# Patient Record
Sex: Male | Born: 1940 | Race: White | Hispanic: No | Marital: Married | Smoking: Never smoker
Health system: Southern US, Community
[De-identification: ages and names within clinical notes are randomized; demographics above are authoritative.]

## PROBLEM LIST (undated history)

## (undated) DIAGNOSIS — I4891 Unspecified atrial fibrillation: Secondary | ICD-10-CM

## (undated) DIAGNOSIS — K5792 Diverticulitis of intestine, part unspecified, without perforation or abscess without bleeding: Secondary | ICD-10-CM

## (undated) DIAGNOSIS — N289 Disorder of kidney and ureter, unspecified: Secondary | ICD-10-CM

## (undated) DIAGNOSIS — E785 Hyperlipidemia, unspecified: Secondary | ICD-10-CM

## (undated) DIAGNOSIS — M1990 Unspecified osteoarthritis, unspecified site: Secondary | ICD-10-CM

## (undated) DIAGNOSIS — I2699 Other pulmonary embolism without acute cor pulmonale: Secondary | ICD-10-CM

## (undated) DIAGNOSIS — I2510 Atherosclerotic heart disease of native coronary artery without angina pectoris: Secondary | ICD-10-CM

## (undated) DIAGNOSIS — M109 Gout, unspecified: Secondary | ICD-10-CM

## (undated) DIAGNOSIS — N400 Enlarged prostate without lower urinary tract symptoms: Secondary | ICD-10-CM

## (undated) DIAGNOSIS — G4733 Obstructive sleep apnea (adult) (pediatric): Secondary | ICD-10-CM

## (undated) DIAGNOSIS — I10 Essential (primary) hypertension: Secondary | ICD-10-CM

## (undated) HISTORY — DX: Diverticulitis of intestine, part unspecified, without perforation or abscess without bleeding: K57.92

## (undated) HISTORY — DX: Unspecified atrial fibrillation: I48.91

## (undated) HISTORY — DX: Gout, unspecified: M10.9

## (undated) HISTORY — DX: Morbid (severe) obesity due to excess calories: E66.01

## (undated) HISTORY — DX: Atherosclerotic heart disease of native coronary artery without angina pectoris: I25.10

## (undated) HISTORY — DX: Hyperlipidemia, unspecified: E78.5

## (undated) HISTORY — DX: Unspecified osteoarthritis, unspecified site: M19.90

## (undated) HISTORY — DX: Benign prostatic hyperplasia without lower urinary tract symptoms: N40.0

## (undated) HISTORY — DX: Obstructive sleep apnea (adult) (pediatric): G47.33

## (undated) HISTORY — DX: Other pulmonary embolism without acute cor pulmonale: I26.99

## (undated) HISTORY — DX: Essential (primary) hypertension: I10

## (undated) HISTORY — DX: Disorder of kidney and ureter, unspecified: N28.9

---

## 2000-10-01 ENCOUNTER — Encounter: Admission: RE | Admit: 2000-10-01 | Discharge: 2000-10-01 | Payer: Self-pay | Admitting: Orthopedic Surgery

## 2000-10-01 ENCOUNTER — Encounter: Payer: Self-pay | Admitting: Orthopedic Surgery

## 2001-08-03 ENCOUNTER — Encounter (INDEPENDENT_AMBULATORY_CARE_PROVIDER_SITE_OTHER): Payer: Self-pay

## 2001-08-03 ENCOUNTER — Ambulatory Visit (HOSPITAL_COMMUNITY): Admission: RE | Admit: 2001-08-03 | Discharge: 2001-08-03 | Payer: Self-pay | Admitting: Gastroenterology

## 2001-09-03 ENCOUNTER — Ambulatory Visit (HOSPITAL_COMMUNITY): Admission: RE | Admit: 2001-09-03 | Discharge: 2001-09-03 | Payer: Self-pay | Admitting: Gastroenterology

## 2001-09-03 ENCOUNTER — Encounter: Payer: Self-pay | Admitting: Gastroenterology

## 2002-04-14 ENCOUNTER — Encounter: Admission: RE | Admit: 2002-04-14 | Discharge: 2002-04-14 | Payer: Self-pay | Admitting: Orthopedic Surgery

## 2002-04-14 ENCOUNTER — Encounter: Payer: Self-pay | Admitting: Orthopedic Surgery

## 2006-12-23 ENCOUNTER — Inpatient Hospital Stay (HOSPITAL_COMMUNITY): Admission: AD | Admit: 2006-12-23 | Discharge: 2006-12-29 | Payer: Self-pay | Admitting: Internal Medicine

## 2007-03-23 ENCOUNTER — Inpatient Hospital Stay (HOSPITAL_BASED_OUTPATIENT_CLINIC_OR_DEPARTMENT_OTHER): Admission: RE | Admit: 2007-03-23 | Discharge: 2007-03-23 | Payer: Self-pay | Admitting: Interventional Cardiology

## 2011-03-26 NOTE — Cardiovascular Report (Signed)
Andrew Liu, Andrew Liu NO.:  [id number]   MEDICAL RECORD NO.:  [id number]          PATIENT TYPE:  OIB   LOCATION:  1963                         FACILITY:  [facility]   PHYSICIAN:  Lyn Records, M.D.   DATE OF BIRTH:  [birth year]   DATE OF PROCEDURE:  DATE OF DISCHARGE:                            CARDIAC CATHETERIZATION   INDICATIONS:  The patient had an episode of atrial flutter in February  and as part of that hospitalization, evaluation with a Cardiolite study  demonstrated apical ischemia.  This study is being done to rule out  coronary atherosclerosis that could have had a role in the genesis of  the arrhythmia.   PROCEDURES PERFORMED:  1. Left heart catheterization.  2. Selective coronary angiography.  3. Left ventriculography.   DESCRIPTION:  After informed consent, a 4-French sheath was placed in  the right femoral artery using the modified Seldinger technique.  A 4-  [place] A2 multipurpose catheter was used for hemodynamic recordings,  left ventriculography by hand injection, and selective right coronary  angiography.  We used a #5 4-French left Judkins catheter for left  coronary angiography.  The patient tolerated the procedure without  complications.   RESULTS:   HEMODYNAMIC DATA:  1. Aortic pressure 100 31/66.  2. Left ventricular pressure 135/25.   LEFT VENTRICULOGRAPHY:  Left ventricle is mildly dilated.  Overall  contractility is low normal.  Estimated ejection fraction is 50%.  This  is a poor quality-study due to inadequate opacification of the LV.  We  were unable to determine the presence or absence of mitral  regurgitation.   CORONARY ANGIOGRAPHY:  1. Left main coronary artery:  Widely patent.  2. Left anterior descending coronary artery:  This vessel is widely      patent, wraps around left ventricular apex, gives origin to one      small distal diagonal.  Luminal irregularities are noted.  No      significant obstruction is seen.  3. Ramus intermedius branch:  The ramus intermedius coronary artery      arises from the distal left main and contains ostial an eccentric      60-70% narrowing.  This vessel is small to moderate in size.  4. Circumflex artery:  The circumflex coronary artery is large.  It      gives origin to two obtuse marginal branches.  The first obtuse      marginal is dominant and trifurcates on the left lateral wall.  The      circumflex is entirely normal.  5. Right coronary artery:  The right coronary artery is a dominant      vessel, gives PDA and two left ventricular branches.  The vessel is      ectatic with up to 30-40% proximal narrowing.  There is 30-40%      distal narrowing.  No significant obstruction is noted in this      vessel.   CONCLUSIONS:  1. Moderate diffuse luminal irregularities noted throughout the right      coronary with up to 40% narrowing proximally and  distally.  The      relatively small ramus intermedius contains 50-70% ostial      narrowing.  The left anterior descending and circumflex contain      minimal luminal irregularities and are widely patent.  2. Poorly-pacified left ventricle by ventriculography.  It appears      that the LV function is low normal.   DISCUSSION:  No significant coronary atherosclerosis is noted.  It is  felt that the reversible anteroapical defect was likely secondary to  soft tissue attenuation.   PLAN:  Continue aggressive risk factor modification, including  significant medical regimen for blood pressure control.      Lyn Records, M.D.  Electronically Signed     HWS/MEDQ  D:  03/23/2007  T:  03/23/2007  Job:  [id number]   cc:   Hal T. Stoneking, M.D.

## 2011-03-29 NOTE — H&P (Signed)
Andrew Liu, Andrew Liu                  ACCOUNT NO.:  [id number]   MEDICAL RECORD NO.:  [id number]          PATIENT TYPE:  INP   LOCATION:  3703                         FACILITY:  [facility]   PHYSICIAN:  Lavinia Sharps, N.P.  DATE OF BIRTH:  1941-10-17   DATE OF ADMISSION:  12/23/2006  DATE OF DISCHARGE:                              HISTORY & PHYSICAL   DICTATED FOR:  Dr. Merlene Laughter.   CHIEF COMPLAINT:  A 70 year old white male referred here from his  urology office following his yearly checkup this morning.   He was found to have a pulse of 135-140.   HISTORY OF PRESENT ILLNESS:  For 3 weeks, he has had chest pressure that  radiated to the right.  It feels more like heartburn than gas.  It comes  and goes.  He notes it is not associated with exertion, sweats or  nausea.   During our evaluation, he was found to be in atrial fibrillation with a  rate of 135.  We decided to admit him.   DRUG ALLERGIES:  NKDA.   CURRENT MEDICATIONS:  1. Enalapril 10 mg a day.  2. Lovastatin 40 mg a day.  3. Aspirin 325 mg 1 a day.  4. Multivitamin 1 a day.  5. Cardura 2 mg 1 at h.s.   KNOWN MEDICAL HISTORY:  1. Carpal tunnel syndrome.  2. Hypertension.  3. Obesity.  4. Varicose veins.  5. History of degenerative joint disease of the knees.  6. History of urinary tract infection.  7. History of colon polyp.  8. History of elevated cholesterol.   SOCIAL HISTORY:  He is married, with 1 son.  He is a retired  Probation officer.  He worked hard for many years.  He lives in [place].  He stopped smoking 9 years ago.  He never consumed alcohol.   REVIEW OF SYSTEMS:  Per chief complaint.   PHYSICAL EXAMINATION:  GENERAL APPEARANCE:  Height 5 feet 8 inches.  Weight 283 pounds.  Temperature 97.7.  Pulse 135.  Blood pressure  130/70.  SKIN:  Multiple seborrheic ketoses.  HEENT:  Eyes:  PERRL.  Ears:  Clear.  Oropharynx clear.  NECK:  No thyroid enlargement,  lymphadenopathy or carotid bruit.  CHEST:   Clear.  HEART:  AFib with a rate of 135.  ABDOMEN EXAM:  Soft and nontender.  GENITALIA:  Deferred since he saw Dr. Retta Diones this morning.  NEUROLOGIC EXAM:  Oriented x3.   ASSESSMENT:  New-onset atrial fibrillation.   PLAN:  Admit for further evaluation and treatment.      Lavinia Sharps, N.P.     MAP/MEDQ  D:  12/23/2006  T:  12/24/2006  Job:  [id number]

## 2011-03-29 NOTE — Discharge Summary (Signed)
Andrew Liu, Andrew Liu                  ACCOUNT NO.:  [id number]   MEDICAL RECORD NO.:  [id number]          PATIENT TYPE:  INP   LOCATION:  3703                         FACILITY:  [facility]   PHYSICIAN:  Barnetta Chapel, MDDATE OF BIRTH:  [birth year]   DATE OF ADMISSION:  12/23/2006  DATE OF DISCHARGE:  12/29/2006                               DISCHARGE SUMMARY   PRIMARY CARE Errika Narvaiz:  Dr. Pete Glatter.   ADMITTING DIAGNOSIS:  New onset atrial fibrillation.   DISCHARGE DIAGNOSES:  Includes:  1. New onset atrial fibrillation.  2. Obesity.  3. Suspect obstructive sleep apnea.  4. Chest pressure/chest pain, most likely secondary to atrial      fibrillation with rapid ventricular response.   DISCHARGE MEDICATIONS:  1. Enalapril 10 mg p.o. once daily.  2. Multivitamin 1 tab p.o. once daily.  3. Aspirin 325 mg p.o. once daily.  4. Cardura 2 mg p.o. q.h.s.  5. Lovastatin 40 mg p.o. once daily.  6. Metoprolol 100 mg p.o. once daily.  7. Coumadin 5 mg p.o. once daily (the patient's INR needs to be      checked in the next 2-4 days).  INR on the day of discharge is 1.8.   PROCEDURES AND INVESTIGATIONS DONE:  Include:  1. The patient had an adenosine Cardiolite, which revealed an ejection      fraction of 54%.  There were also concerns for a small area of      ischemia of the anterior apical area.  Because the patient was not      having any chest pain anymore, the cardiologist felt that they      would not proceed with a cardiac cath.  However, they will plan for      a cardiac cath if the patient develops chest pain after discharge      home.   BRIEF HISTORY AND HOSPITAL COURSE:  The patient is a 69 year old male  with past medical history significant for obesity, hypertension and  carpal tunnel syndrome.  The patient went to see his primary care  physician for a routine physical.  He was found to be in atrial  fibrillation with rapid ventricular response.  He was asked to come to  [facility] for admission and further workup.  On admission, he  was started on a Cardizem drip.  Metoprolol was added eventually.  The  patient converted to normal sinus rhythm after a couple of days.  Prior  to admission, he reported having nonspecific chest pain and pressures.  He is obese with symptoms suggestive of possible obstructive sleep  apnea.  The patient eventually had an adenosine Cardiolite (please see  the above for the results).  He was seen by Dr. Katrinka Blazing, Cardiologist with  the [facility].  On admission, he was started on percutaneous Lovenox,  as well as Coumadin.  INR, today, is 1.8.  He is going to be discharged  back home on Coumadin 5 mg p.o. once daily.  INR should be rechecked by  the primary care Majesti Gambrell in the next 2-4 days.  Vitals  are all stable.  The patient has remained in normal sinus rhythm.   DISCHARGE PLANS:  1. Discharge patient back home today.  2. Patient to follow up with the primary care physician in a week.  3. For patient to have his INR checked within the next 2-4 days.  4. The patient should follow up with Dr. Katrinka Blazing, the Cardiologist with      the [facility].  5. Low-fat/cardiac diet.  6. The patient will benefit from a sleep study as an outpatient.  7. Fall precaution.  8. Activity as tolerated.      Barnetta Chapel, MD  Electronically Signed     SIO/MEDQ  D:  12/29/2006  T:  12/30/2006  Job:  [id number]   cc:   Marvia Pickles, M.D.

## 2011-03-29 NOTE — Procedures (Signed)
[facility]  Patient:    Andrew Liu, Andrew Liu. Visit Number: [id number] MRN: [id number]          Service Type: Attending:  Verlin Grills, M.D. Proc. Date: 08/03/01                             Procedure Report  PROCEDURE PERFORMED:  Colonoscopy to the mid ascending colon.  ENDOSCOPIST:  Verlin Grills, M.D.  INDICATIONS:  Andrew Liu, Lassen., (date of birth 03/11/2041) is a 70 year old male who intermittently passes fresh blood with constipated bowel movements. His younger sister was diagnosed with colon cancer.  Dr. Dot Been is due for his first colonoscopy with polypectomy to prevent colon cancer.  ALLERGIES:  No known drug allergies.  MEDICATIONS:  Chronic medications include Monopril, aspirin, and multivitamins.  PAST MEDICAL/SURGICAL HISTORY:  Significant for colonic diverticulosis by flexible proctosigmoidoscopy, hypertension, herniated disk surgery, herniorrhaphy, and carpal tunnel release surgery.  PREMEDICATIONS:  Versed 8 mg and Demerol 50 mg.  ENDOSCOPE:  Olympus pediatric colonoscope.  DESCRIPTION OF PROCEDURE:  After obtaining informed consent, Andrew Liu was placed in the left lateral decubitus position.  I administered intravenous Versed and intravenous Demerol to achieve conscious sedation for the procedure.  The patients blood pressure, oxygen saturation, and cardiac rhythm were monitored throughout the procedure and documented in the medical record.  Anal inspection is normal.  Digital rectal exam revealed a non-nodular prostate.  The Olympus pediatric video colonoscope was introduced into the rectum and advanced to the mid ascending colon at which point, the entire colonoscope had been introduced into the colon.  Repositioning the patient from the left lateral decubitus position to the supine position and then to the right lateral decubitus position with an abdominal pressure that would not allow any further visualization  with the colonoscope.  Colonic preparation for the exam today was excellent.  Rectum normal.  Sigmoid colon and descending colon showed extensive left colonic diverticulosis.  At approximately 40 cm from the anal verge, a 2 mm sessile polyp was removed with the electrocautery snare and submitted for pathologic interpretation.  Splenic flexure normal.  Transverse colon normal.  From the hepatic flexure, a 0.5 mm sessile polyp was removed with the cold biopsy forceps.  Ascending colon normal.  Cecum and ileocecal valve were not examined.  ASSESSMENT: 1. Excessive left colonic diverticulosis. 2. A 0.5 mm sessile polyp was removed from the hepatic flexure    with the cold biopsy forceps and a 2-mm sessile polyp was    removed from the sigmoid colon with the electrocautery snare.    Both polyps were submitted in one bottle for pathologic    examination. 3. I was unable to reach the cecum with the colonoscope.  RECOMMENDATIONS:  I will schedule Andrew Liu for an air contrast barium enema in approximately four weeks. Attending:  Verlin Grills, M.D. DD:  08/03/01 TD:  08/03/01 Job: [id number]

## 2011-03-29 NOTE — Consult Note (Signed)
NAMEGWEN, EDLER NO.:  [id number]   MEDICAL RECORD NO.:  [id number]          PATIENT TYPE:  INP   LOCATION:  3703                         FACILITY:  [facility]   PHYSICIAN:  Lyn Records, M.D.   DATE OF BIRTH:  June 18, 1941   DATE OF CONSULTATION:  DATE OF DISCHARGE:                                 CONSULTATION   REASON FOR CONSULTATION:  Atrial flutter.   CONCLUSIONS:  1. Atrial flutter with rapid ventricular response.  Duration of      probably two to three weeks.  2. History of hypertension.  3. Hyperlipidemia.  4. Obesity.  5. Probable obstructive sleep apnea.   RECOMMENDATIONS:  1. Given the duration of the patient's atrial flutter and the      difficulty we will have with attempting rate control, will arrange      transesophageal-guided electrical cardioversion for December 25, 2006.  2. Will continue IV heparin and/or Lovenox.  3. Transthoracic echo.  4. Consider ischemic evaluation after the patient is in rhythm.  5. Initiate anti-arrhythmic therapy if recurrent atrial flutter.  6. Chronic Coumadin therapy for at least a month following      cardioversion.  7. Ischemic evaluation perhaps as an outpatient, especially if chest      discomfort continues.   DESCRIPTION:  The patient is 70 and has been in relatively good health.  He is obese.  His wife complains that he snores very loudly and she can  hear him from one end of the house to the other.  He stops breathing at  night.  It is easy for him to fall asleep during the daytime.   Three weeks ago he began noticing that any activity will wear him out.  He feels short of breath and tight in the chest.  No orthopnea or PND.  He did hear some wheezing.  He has not had syncope but has had the  sensation that his heart is racing.  He saw his urologist yesterday and  he discovered that the patient's heart was racing and referred him to  Dr. Pete Glatter, who identified atrial flutter and admitted  him to the  hospital.  He has been in the hospital overnight on IV Cardizem with  poor rate control.   CURRENT MEDICAL REGIMEN:  1. Enalapril 10 mg per day.  2. Lovastatin 40 mg per day.  3. Aspirin 325 mg per day.  4. Cardura 2 mg q.h.s.  5. Multiple vitamins 1 per day.   SIGNIFICANT MEDICAL PROBLEMS:  1. Varicose veins.  2. Obesity.  3. Hypertension.  4. Carpal tunnel syndrome.  5. Degenerative joint disease.  6. History of herniated disk in 1988.  7. Hernia repair in 1989.  8. Arthroscopic surgery of both knees.   FAMILY HISTORY:  Mother died of progressive dementia.  Father died of  prostate cancer at age 22.  Has a half brother with Parkinson's.  A half  sister with degenerative joint disease.   REVIEW OF SYSTEMS:  Appetite is good.  Stopped smoking nine years ago.  Denies ethanol consumption.   PHYSICAL EXAMINATION:  GENERAL: The patient is in no acute distress.  VITAL SIGNS: Blood pressure 115/72.  Heart rate 80 to 115.  O2  saturation on 1 liter per minute is 95%.  Unable to assess the patient's  neck veins.  CHEST: Clear.  CARDIAC EXAM: Reveals a rapid irregular rhythm.  No murmur, no gallop.  ABDOMEN: Soft.  EXTREMITIES: No edema.  Pulses 2+ and symmetric in the upper and lower  extremities.   LABORATORY DATA:  Reveals typical flutter on EKG with upright P flutter  waves inferiorly and inverted flutter waves in lead V1.  Rate of 135  beats per minute.  No acute ST T-wave change.  Potassium on admission  4.1.  Creatinine 1.15.  Hemoglobin 14.3.  Troponin I and CK-MB are  normal.  INR is 1.1.  TSH 1.2.   DISCUSSION:  The patient has atrial flutter.  We will have difficulty  with rate control.  He feels poorly with the rapid heart rate.  We will  go ahead and try to facilitate conversion to normal rhythm by pursuing a  transesophageal echo.  We will continue beta blocker therapy.  He will  need to have an ischemic evaluation after his heart rhythm is   controlled.  He will need Coumadin therapy for at least four weeks  following conversion.      Lyn Records, M.D.  Electronically Signed     HWS/MEDQ  D:  12/24/2006  T:  12/24/2006  Job:  [id number]   cc:   Hal T. Stoneking, M.D.

## 2012-02-21 ENCOUNTER — Ambulatory Visit (HOSPITAL_COMMUNITY)
Admission: RE | Admit: 2012-02-21 | Discharge: 2012-02-21 | Disposition: A | Discharge status: Home / Self Care | Payer: Medicare Other | Source: Ambulatory Visit | Attending: Urology | Admitting: Urology

## 2012-02-21 ENCOUNTER — Other Ambulatory Visit: Payer: Self-pay | Admitting: Urology

## 2012-02-21 DIAGNOSIS — R042 Hemoptysis: Secondary | ICD-10-CM | POA: Insufficient documentation

## 2014-01-02 IMAGING — CR DG CHEST 2V
2 series · 2 of 2 positions shown · non-contrast
Comparison: 12/24/2006

CLINICAL DATA: Hemoptysis

CHEST - 2 VIEW

[w chest pa]
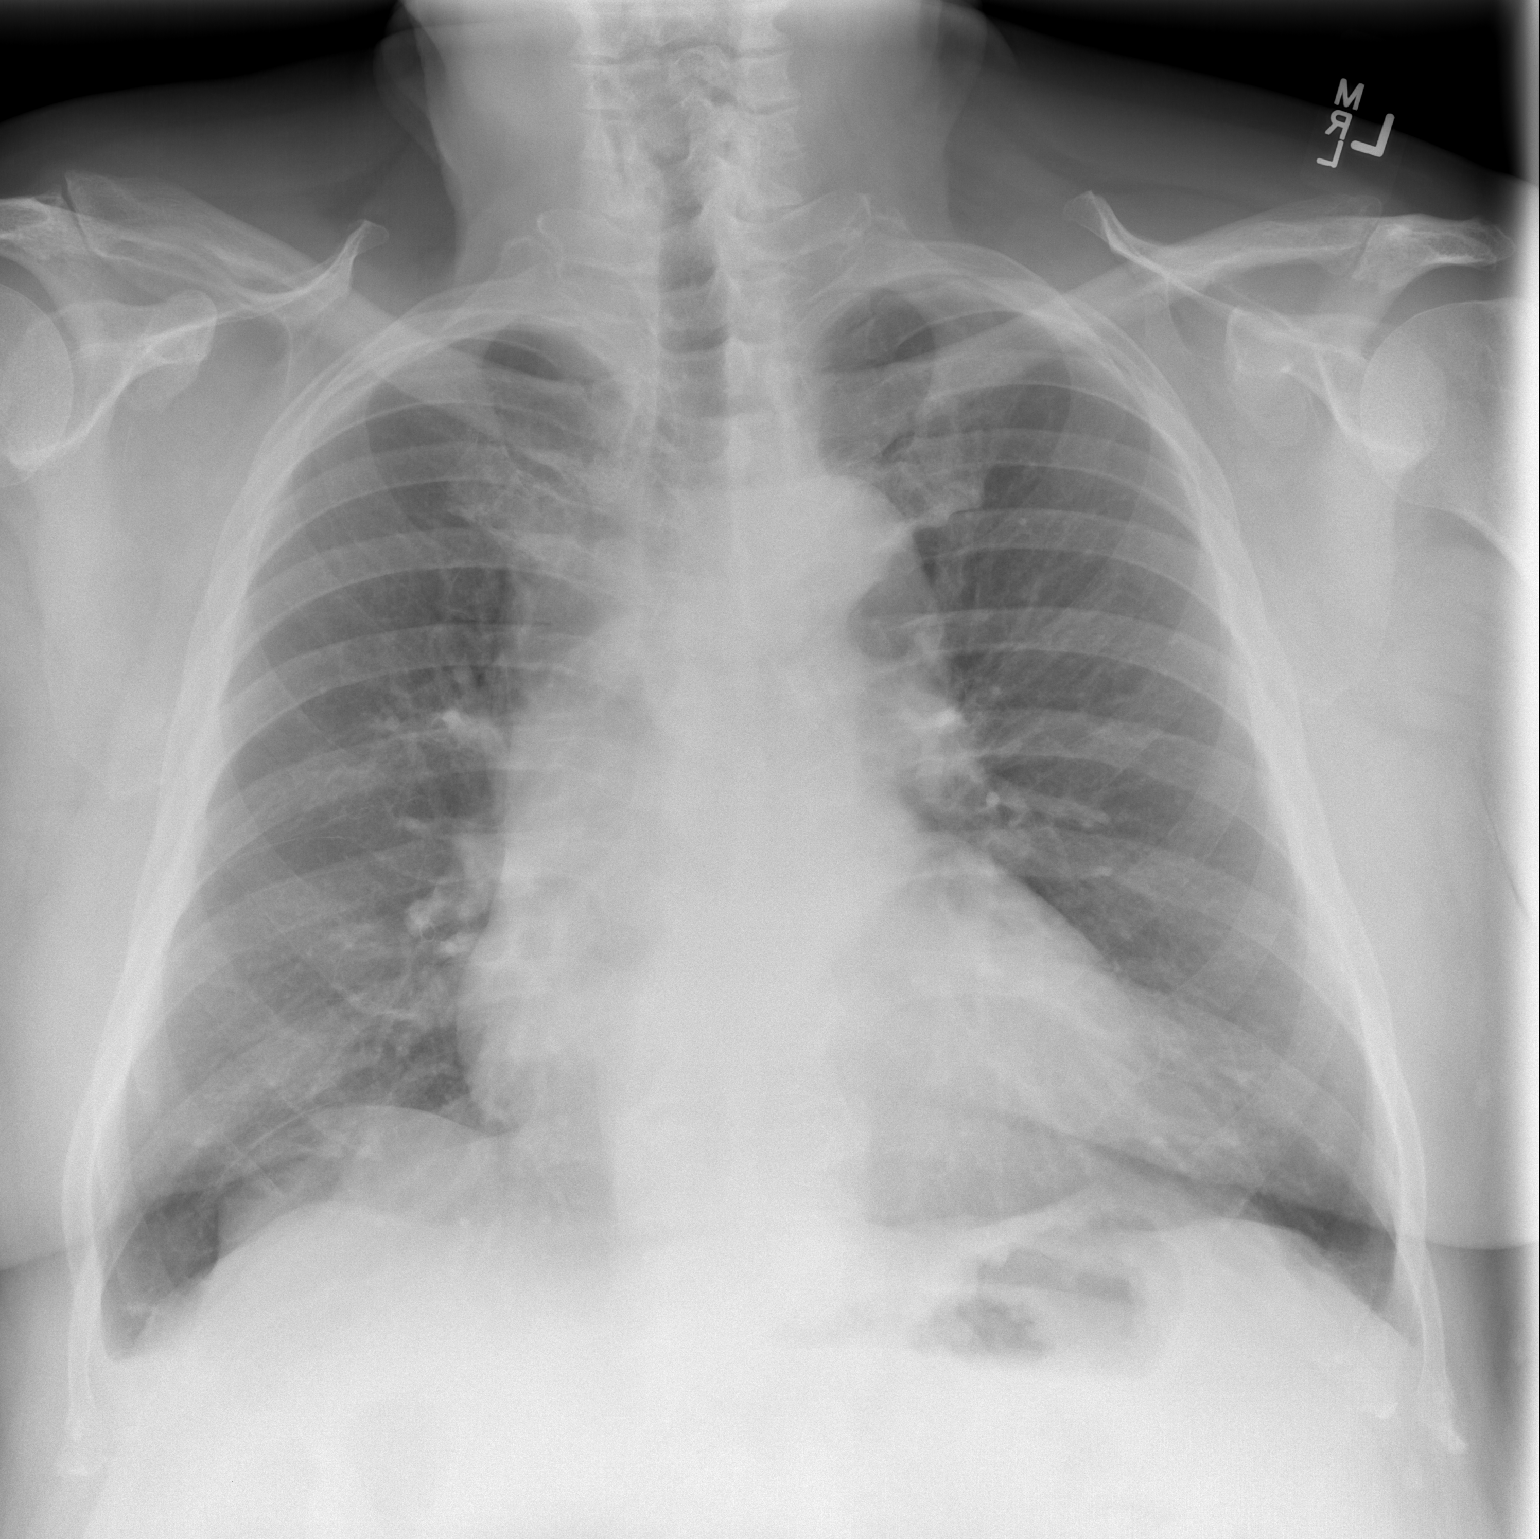

[w chest lat]
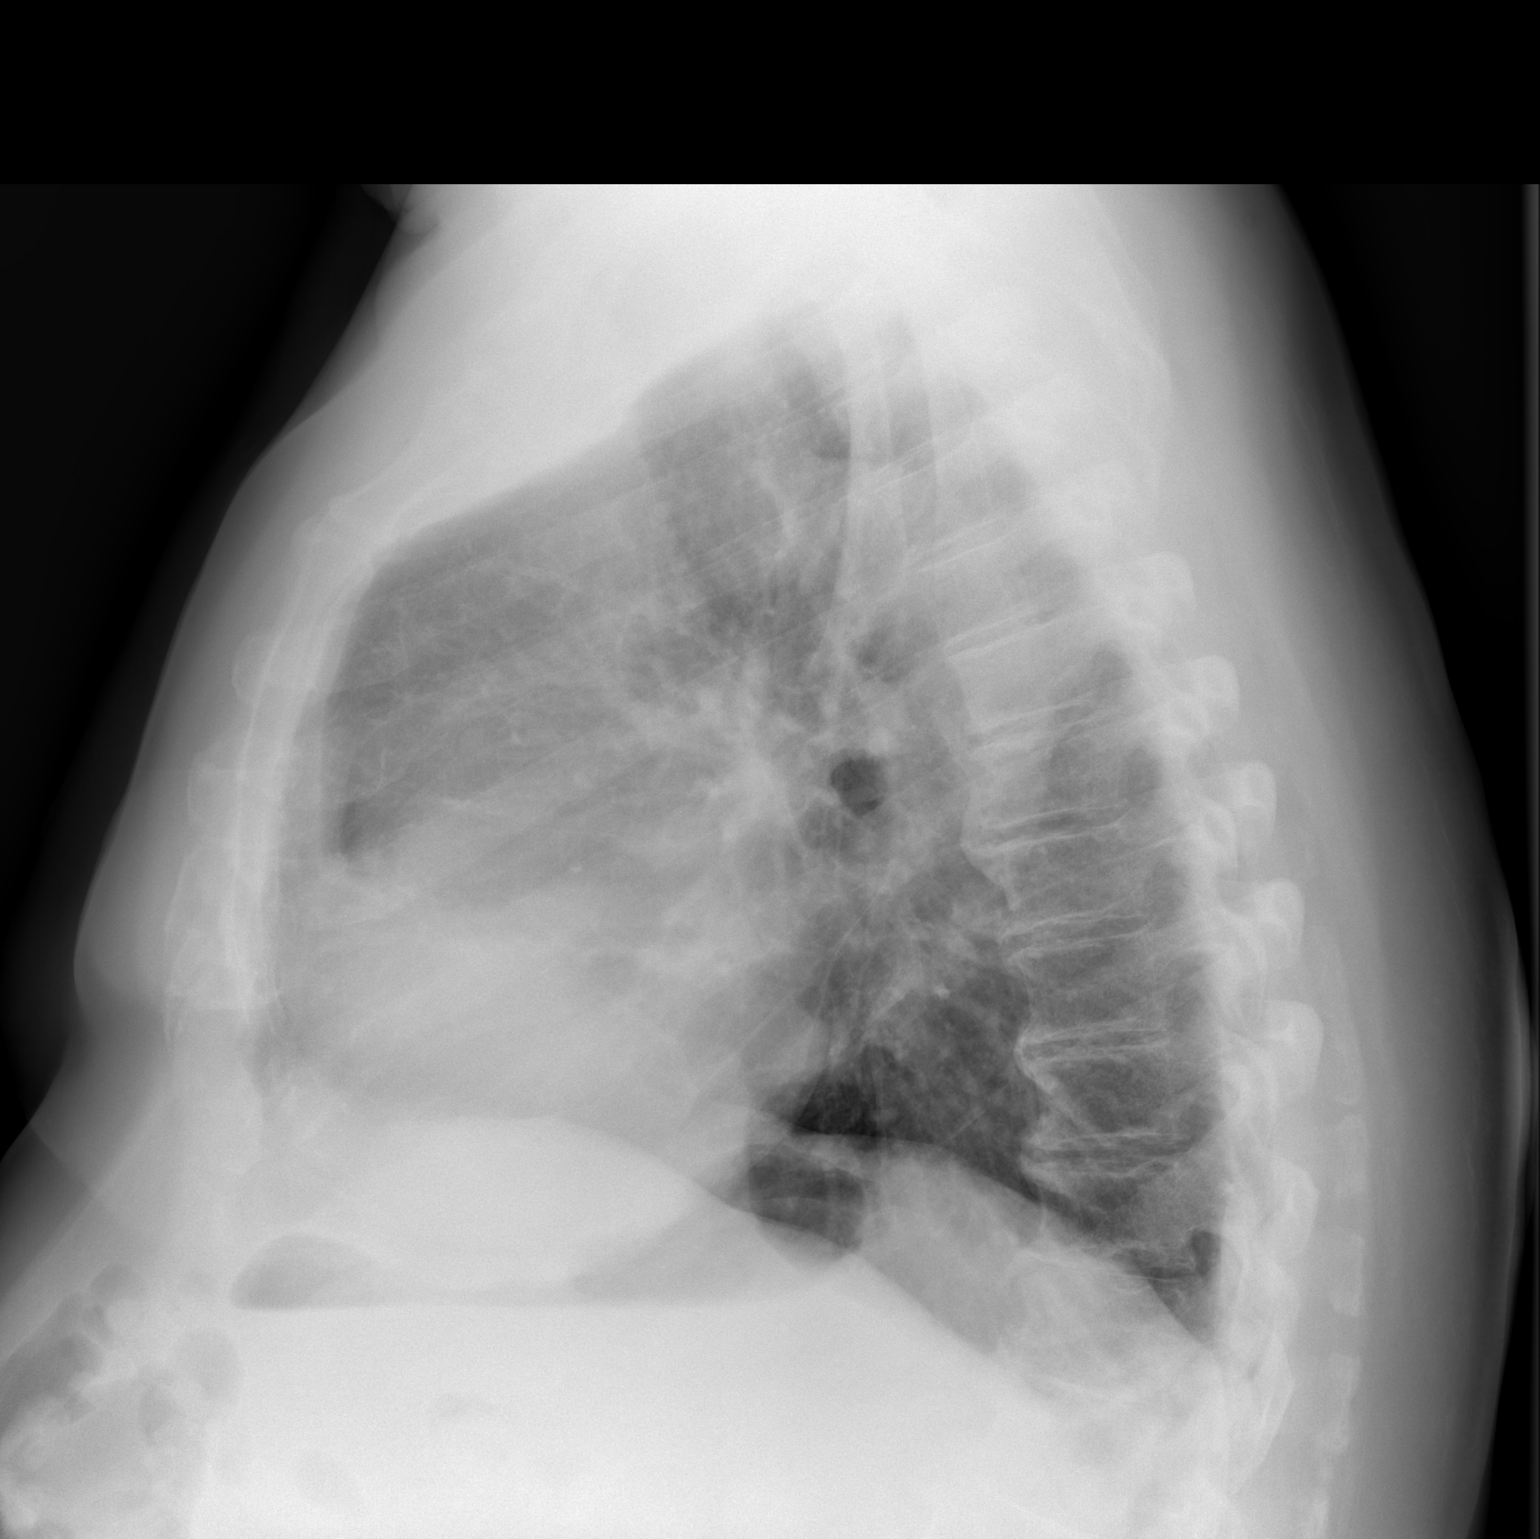

[2 of 2 positions shown; findings below may reference images not displayed]

FINDINGS: Cardiomediastinal silhouette is unremarkable.  No acute
infiltrate or pleural effusion.  No pulmonary edema.  Mild tenting
of the right hemidiaphragm.  Degenerative changes thoracic spine.
IMPRESSION: No active disease.

## 2023-04-17 ENCOUNTER — Encounter: Payer: Self-pay | Admitting: *Deleted

## 2023-04-17 ENCOUNTER — Other Ambulatory Visit: Payer: Self-pay | Admitting: *Deleted

## 2023-04-21 ENCOUNTER — Ambulatory Visit (INDEPENDENT_AMBULATORY_CARE_PROVIDER_SITE_OTHER): Payer: Medicare HMO | Admitting: Neurology

## 2023-04-21 ENCOUNTER — Encounter: Payer: Self-pay | Admitting: Neurology

## 2023-04-21 VITALS — BP 98/61 | HR 62 | Ht 69.0 in | Wt 250.0 lb

## 2023-04-21 DIAGNOSIS — G4733 Obstructive sleep apnea (adult) (pediatric): Secondary | ICD-10-CM

## 2023-04-21 DIAGNOSIS — E669 Obesity, unspecified: Secondary | ICD-10-CM

## 2023-04-21 DIAGNOSIS — R351 Nocturia: Secondary | ICD-10-CM

## 2023-04-21 DIAGNOSIS — G4719 Other hypersomnia: Secondary | ICD-10-CM

## 2023-04-21 DIAGNOSIS — I480 Paroxysmal atrial fibrillation: Secondary | ICD-10-CM

## 2023-04-21 NOTE — Progress Notes (Signed)
Subjective:    Andrew Liu ID: Andrew Andrew Liu is a 82 y.o. male.  HPI    Huston Foley, MD, PhD [facility] [address] [address] [place], [place] [address]  Dear Dr. Mikey Bussing,   I saw your Andrew Liu, Andrew Andrew Liu, upon your kind request in my sleep clinic today for initial consultation of his sleep disorder, in particular, evaluation of his prior diagnosis of sleep apnea.  Andrew Andrew Liu is unaccompanied today.  As you know, Andrew Andrew Liu is an 82 year old male with an underlying medical history of hypertension, chronic kidney disease, gout, paroxysmal A-fib on Eliquis, hyperlipidemia, reflux disease, allergies, BPH, coronary artery disease, aortic atherosclerosis, osteoarthritis, history of PE, and obesity, who was previously diagnosed with sleep apnea and placed on PAP therapy.  His Epworth sleepiness score is 14 out of 24, fatigue severity score is 51 out of 63.  I reviewed your office note from 02/18/2023.  Prior sleep study results are not available for my review today.  Andrew Andrew Liu has reportedly been on BiPAP therapy but has not used his machine in at least 6 months.  Andrew Andrew Liu did not bring Andrew machine today, a compliance download is not available for my review today.  Andrew Andrew Liu reports that Andrew Andrew Liu had a BiPAP machine, Andrew Andrew Liu had a [employer] which was recalled, Andrew Andrew Liu then apparently purchased a machine out-of-pocket through his DME provider, [facility] Andrew Liu but had trouble tolerating Andrew pressure, unclear what pressure settings Andrew Andrew Liu was on, Andrew Andrew Liu recalls a pressure setting of 5-15.  This could be an AutoPap machine.  Andrew Andrew Liu is not using this machine either.  Overall, when Andrew Andrew Liu first got his old machine, Andrew Andrew Liu felt benefit, particularly better sleep consolidation and better sleep quality, less daytime somnolence.  Andrew Andrew Liu is widowed and lives alone, Andrew Andrew Liu lost his only son at age 19 from pancreatic cancer.   His bedtime is generally between 11 PM and midnight and rise time around 8.  Andrew Andrew Liu has nocturia about 2-3 times per  average night.  Andrew Andrew Liu had a tonsillectomy at age 87.  Andrew Andrew Liu is not aware of any family history of sleep apnea. His sleep study was about 11 years ago.  Andrew Andrew Liu would be willing to get retested and start treatment again with a PAP machine. His Past Medical History Is Significant For: Past Medical History:  Diagnosis Date   A-fib (HCC)    Benign prostate hyperplasia    Coronary artery disease    Diverticulitis    Gout    Hyperlipidemia    Hypertension    Kidney disease    Stage 3B   Morbid obesity (HCC)    OSA treated with BiPAP    Osteoarthritis    both knees   Pulmonary embolism (HCC)     His Past Surgical History Is Significant For: History reviewed. No pertinent surgical history.  His Family History Is Significant For: Family History  Problem Relation Age of Onset   Sleep apnea Neg Hx     His Social History Is Significant For: Social History   Socioeconomic History   Marital status: Married    Spouse name: Not on file   Number of children: Not on file   Years of education: Not on file   Highest education level: Not on file  Occupational History   Not on file  Tobacco Use   Smoking status: Never   Smokeless tobacco: Never  Substance and Sexual Activity   Alcohol use: Not on file   Drug use: Not on file  Sexual activity: Not on file  Other Topics Concern   Not on file  Social History Narrative   Not on file   Social Determinants of Health   Financial Resource Strain: Not on file  Food Insecurity: Not on file  Transportation Needs: Not on file  Physical Activity: Not on file  Stress: Not on file  Social Connections: Not on file    His Allergies Are:  No Known Allergies:   His Current Medications Are:  Outpatient Encounter Medications as of 04/21/2023  Medication Sig   allopurinol (ZYLOPRIM) 100 MG tablet Take 100 mg by mouth 2 (two) times daily.   apixaban (ELIQUIS) 5 MG TABS tablet Take 5 mg by mouth 2 (two) times daily.   azelastine (ASTELIN) 0.1 % nasal  spray Place 1 spray into both nostrils as needed for rhinitis. Use in each nostril as directed   doxazosin (CARDURA) 2 MG tablet Take 2 mg by mouth 2 (two) times daily.   furosemide (LASIX) 20 MG tablet Take 20 mg by mouth as needed.   lovastatin (MEVACOR) 40 MG tablet Take 40 mg by mouth at bedtime.   metoprolol succinate (TOPROL-XL) 50 MG 24 hr tablet Take 50 mg by mouth daily.   pantoprazole (PROTONIX) 40 MG tablet Take 40 mg by mouth daily.   No facility-administered encounter medications on file as of 04/21/2023.  :   Review of Systems:  Out of a complete 14 point review of systems, all are reviewed and negative with Andrew exception of these symptoms as listed below:  Review of Systems  Neurological:        Pt here for sleep consult Pt states fatigue,hypertension Pt denies headaches,snoring Pt states had sleep study 11 years ago . Pt states  haven't   used current pap machine for 6 months     ESS:14  FSS:51    Objective:  Neurological Exam  Physical Exam Physical Examination:   Vitals:   04/21/23 1423  BP: 98/61  Pulse: 62    General Examination: Andrew Andrew Liu is a very pleasant 82 y.o. male in no acute distress. Andrew Andrew Liu appears well-developed and well-nourished and well groomed.   HEENT: Normocephalic, atraumatic, pupils are equal, round and reactive to light, extraocular tracking is good without limitation to gaze excursion or nystagmus noted. Hearing is grossly intact. Face is symmetric with normal facial animation.  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  Speech is clear with no dysarthria noted. There is no hypophonia. There is no lip, neck/head, jaw or voice tremor. Neck is supple with full range of passive and active motion. There are no carotid bruits on auscultation. Oropharynx exam reveals: moderate mouth dryness, adequate dental hygiene with few teeth on Andrew bottom, edentulous on top, no dentures.  Neck circumference 17 7/8 inches.  Tongue protrudes centrally and  palate elevates symmetrically.    Chest: Clear to auscultation without wheezing, rhonchi or crackles noted.  Heart: S1+S2+0, regular and normal without murmurs, rubs or gallops noted.   Abdomen: Soft, non-tender and non-distended.  Extremities: There is trace pitting edema in Andrew distal lower extremities bilaterally.   Skin: Warm and dry without trophic changes noted.   Musculoskeletal: exam reveals no obvious joint deformities.   Neurologically:  Mental status: Andrew Andrew Liu is awake, alert and oriented in all 4 spheres. His immediate and remote memory, attention, language skills and fund of knowledge are appropriate. There is no evidence of aphasia, agnosia, apraxia or anomia. Speech is clear with normal prosody and  enunciation. Thought process is linear. Mood is normal and affect is normal.  Cranial nerves II - XII are as described above under HEENT exam.  Motor exam: Normal bulk, strength and tone is noted. There is no obvious action or resting tremor.  Fine motor skills and coordination: grossly intact.  Cerebellar testing: No dysmetria or intention tremor. There is no truncal or gait ataxia.  Sensory exam: intact to light touch in Andrew upper and lower extremities.  Gait, station and balance: Andrew Andrew Liu stands easily. No veering to one side is noted. No leaning to one side is noted. Posture is age-appropriate and stance is narrow based. Gait shows normal stride length and normal pace. No problems turning are noted.   Assessment and Plan:  In summary, Andrew Andrew Liu is a very pleasant 82 y.o.-year old male with an underlying medical history of hypertension, chronic kidney disease, gout, paroxysmal A-fib on Eliquis, hyperlipidemia, reflux disease, allergies, BPH, coronary artery disease, aortic atherosclerosis, osteoarthritis, history of PE, and obesity, who presents for evaluation of his sleep apnea.  Andrew Andrew Liu was diagnosed with a laboratory attended sleep study 711 years ago and had a BiPAP machine or  perhaps an AutoPap machine.  Andrew Andrew Liu is advised to proceed with sleep testing.  Andrew Andrew Liu has benefited from PAP therapy in Andrew past and would like to get a new machine.   I had a long chat with Andrew Andrew Liu about my findings and Andrew diagnosis of sleep apnea, particularly OSA, its prognosis and treatment options. We talked about medical/conservative treatments, surgical interventions and non-pharmacological approaches for symptom control. I explained, in particular, Andrew risks and ramifications of untreated moderate to severe OSA, especially with respect to developing cardiovascular disease down Andrew road, including congestive heart failure (CHF), difficult to treat hypertension, cardiac arrhythmias (particularly A-fib), neurovascular complications including TIA, stroke and dementia. Even type 2 diabetes has, in part, been linked to untreated OSA. Symptoms of untreated OSA may include (but may not be limited to) daytime sleepiness, nocturia (i.e. frequent nighttime urination), memory problems, mood irritability and suboptimally controlled or worsening mood disorder such as depression and/or anxiety, lack of energy, lack of motivation, physical discomfort, as well as recurrent headaches, especially morning or nocturnal headaches. We talked about Andrew importance of maintaining a healthy lifestyle and striving for healthy weight.  I recommended a sleep study at this time. I outlined Andrew differences between a laboratory attended sleep study which is considered more comprehensive and accurate over Andrew option of a home sleep test (HST); Andrew latter may lead to underestimation of sleep disordered breathing in some instances and does not help with diagnosing upper airway resistance syndrome and is not accurate enough to diagnose primary central sleep apnea typically. I outlined possible surgical and non-surgical treatment options of OSA, including Andrew use of a positive airway pressure (PAP) device (i.e. CPAP, AutoPAP/APAP or BiPAP in  certain circumstances), a custom-made dental device (aka oral appliance, which would require a referral to a specialist dentist or orthodontist typically, and is generally speaking not considered for patients with full dentures or edentulous state), upper airway surgical options, such as traditional UPPP (which is not considered a first-line treatment) or Andrew Inspire device (hypoglossal nerve stimulator, which would involve a referral for consultation with an ENT surgeon, after careful selection, following inclusion criteria - also not first-line treatment).  We mutually agreed to pursue PAP therapy again for him.  We will pick up our discussion about Andrew next steps and treatment options after testing.  We will  keep him posted as to Andrew test results by phone call and/or MyChart messaging where possible.  We will plan to follow-up in sleep clinic accordingly as well.  I answered all his questions today and Andrew Andrew Liu was in agreement.   I encouraged him to call with any interim questions, concerns, problems or updates or email [place] through MyChart.  Generally speaking, sleep test authorizations may take up to 2 weeks, sometimes less, sometimes longer, Andrew Andrew Liu is encouraged to get in touch with [place] if they do not hear back from Andrew sleep lab staff directly within Andrew next 2 weeks.  Thank you very much for allowing me to participate in Andrew care of this nice Andrew Liu. If I can be of any further assistance to you please do not hesitate to call me at [phone].  Sincerely,   Huston Foley, MD, PhD

## 2023-04-21 NOTE — Patient Instructions (Signed)

## 2023-05-02 ENCOUNTER — Telehealth: Payer: Self-pay | Admitting: Neurology

## 2023-05-02 NOTE — Telephone Encounter (Signed)
[employer] pending uploaded notes.

## 2023-05-05 NOTE — Telephone Encounter (Signed)
Checked status on the poral it is still pending.

## 2023-05-07 NOTE — Telephone Encounter (Signed)
[employer] medicare [place]kley Harvey: [id number] (exp. 05/02/23 to 11/02/23)

## 2023-06-09 NOTE — Telephone Encounter (Signed)
PSG- [employer] medicare auth: [id number] (exp. 05/02/23 to 11/02/23   Patient is scheduled at [facility] for 08/18/23 at 9 pm.  Mailed packet to the patient.

## 2023-08-18 ENCOUNTER — Ambulatory Visit (INDEPENDENT_AMBULATORY_CARE_PROVIDER_SITE_OTHER): Payer: Medicare HMO | Admitting: Neurology

## 2023-08-18 DIAGNOSIS — G4733 Obstructive sleep apnea (adult) (pediatric): Secondary | ICD-10-CM | POA: Diagnosis not present

## 2023-08-18 DIAGNOSIS — I480 Paroxysmal atrial fibrillation: Secondary | ICD-10-CM

## 2023-08-18 DIAGNOSIS — R9431 Abnormal electrocardiogram [ECG] [EKG]: Secondary | ICD-10-CM

## 2023-08-18 DIAGNOSIS — G4761 Periodic limb movement disorder: Secondary | ICD-10-CM

## 2023-08-18 DIAGNOSIS — E669 Obesity, unspecified: Secondary | ICD-10-CM

## 2023-08-18 DIAGNOSIS — G4719 Other hypersomnia: Secondary | ICD-10-CM

## 2023-08-18 DIAGNOSIS — G4720 Circadian rhythm sleep disorder, unspecified type: Secondary | ICD-10-CM

## 2023-08-18 DIAGNOSIS — R351 Nocturia: Secondary | ICD-10-CM

## 2023-08-19 NOTE — Procedures (Unsigned)
Physician Interpretation:    Referred by: Lindwood Qua, MD    History and Indication for Testing: 82 year old male with an underlying medical history of hypertension, chronic kidney disease, gout, paroxysmal A-fib on Eliquis, hyperlipidemia, reflux disease, allergies, BPH, coronary artery disease, aortic atherosclerosis, osteoarthritis, history of PE, and obesity, who was previously diagnosed with sleep apnea and placed on PAP therapy.  His Epworth sleepiness score is 14 out of 24, fatigue severity score is 51 out of 63.  He may have been on an autoPAP and/or a BiPAP machine in the past, but has not used a PAP machine in over 9 months. He presents for re-evaluation of his sleep apnea.    EEG: Review of the EEG showed no abnormal electrical discharges and symmetrical bihemispheric findings.     EKG: The EKG revealed normal sinus rhythm (NSR) with occasional PVCs and PACs noted. ***   AUDIO/VIDEO REVIEW: The audio and video review did not show any abnormal or unusual behaviors, movements, phonations or vocalizations with the exception of rare brief sleep talking***. The patient took 2 restroom breaks. Snoring was noted, mostly in the mild range.    POST-STUDY QUESTIONNAIRE: Post study, the patient indicated, that sleep was worse than usual.    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 Treatment with a positive airway pressure (PAP) device is recommended.  The patient will be advised to proceed with home autoPAP therapy for now.  A laboratory attended, full night PAP-titration study can be considered down the road, to  optimize therapy settings, mask fit, monitoring of tolerance and of proper oxygen saturations. Other treatment options may be limited, and may include (generally speaking) surgical options in selected patients. *** Concomitant weight loss is highly recommended. Please note that untreated obstructive sleep apnea may carry additional perioperative morbidity. Patients with significant obstructive sleep apnea should receive perioperative PAP therapy and the surgeons and particularly the anesthesiologist should be informed of the diagnosis and the severity of the sleep disordered breathing. This study shows sleep fragmentation and abnormal sleep stage percentages; these are nonspecific findings and per se do not signify an intrinsic sleep disorder or a cause for the patient's sleep-related symptoms. Causes include (but are not limited to) the first night effect of the sleep study, circadian rhythm disturbances, medication effect or an underlying mood disorder or medical problem.  The patient should be cautioned not to drive, work at heights, or operate dangerous or heavy equipment when tired or sleepy. Review and reiteration of good sleep hygiene measures should be pursued with any patient. Moderate PLMs (periodic limb movements of sleep) were noted during this study with no significant arousals; clinical correlation is recommended. PLMs may improve with OSA treatment.   The study showed occasional PACs and PVCs on single lead EKG; clinical correlation is recommended. The patient is well-known to cardiology (at [facility]).  The patient will be seen in follow-up in the sleep clinic at [facility] for discussion of the test results, symptom and treatment compliance review, further management strategies, etc. The patient and the referring provider will be notified of the test results.   I certify that I have reviewed the entire raw data recording prior  to the issuance of this report in accordance with the Standards of  Accreditation of the American Academy of Sleep Medicine (AASM).   Huston Foley, MD, PhD Medical Director, [facility]) Diplomat, ABPN (Neurology and Sleep)   Technical Report:   General Information  Name: Lenardo, Westwood BMI: 36.92 Physician: Huston Foley, MD  ID: [id number] Height: 69.0 in Technician: Domingo Cocking, RPSGT  Sex: Male Weight: 250.0 lb Record: [id number]  Age: 3 [01-29-1941] Date: 08/18/2023    Medical & Medication History    Mr. Mincey is an 82 year old male with an underlying medical history of hypertension, chronic kidney disease, gout, paroxysmal A-fib on Eliquis, hyperlipidemia, reflux disease, allergies, BPH, coronary artery disease, aortic atherosclerosis, osteoarthritis, history of PE, and obesity, who was previously diagnosed with sleep apnea and placed on PAP therapy. His Epworth sleepiness score is 14 out of 24, fatigue severity score is 51 out of 63. I reviewed your office note from 02/18/2023. Prior sleep study results are not available for my review today. He has reportedly been on BiPAP therapy but has not used his machine in at least 6 months. He did not bring the machine today, a compliance download is not available for my review today. He reports that he had a BiPAP machine, he had a [employer] which was recalled, he then apparently purchased a machine out-of-pocket through his DME provider, [facility] patient but had trouble tolerating the pressure, unclear what pressure settings he was on, he recalls a pressure setting of 5-15. This could be an AutoPap machine. He is not using this machine either. Overall, when he first got his old machine, he felt benefit, particularly better sleep consolidation and better sleep quality, less daytime somnolence. He is widowed and lives alone, he lost his only son at age 75 from pancreatic cancer. His bedtime is generally between 11 PM and midnight and rise time around 8. He has  nocturia about 2-3 times per average night. He had a tonsillectomy at age 53. He is not aware of any family history of sleep apnea.  Zyloprim, Eliquis, AStelin, Cardura, Lasix, Mevacor, Toprol-XL, Protonix   Sleep Disorder      Comments   Patient arrived for a diagnostic polysomnogram. Procedure explained and all questions answered. Patient has a history of sleep apnea with self reported good usage of BiPAP in the past. Not currently using any devices, after he reported returning the machine upon request. Standard paste setup without complications. Patient slept supine, left, and right. Occasional mild snoring was heard. Respiratory events observed, worse while supine. After 2 hours total sleep time, AHI = 25. Cardiac arrhythmias noted, including PVC's and PAC's. Patient has a known cardiac history, having paroxysmal atrial fibrillation and heart failure. PLMS observed. Occasional somniloquy. Nocturia x 2.    Lights out: 09:39:58 PM Lights on: 04:59:40 AM   Time Total Supine Side Prone Upright  Recording (TRT) 7h 20.38m 2h 52.59m 4h 28.57m 0h 0.70m 0h 0.7m  Sleep (TST) 4h 28.36m 1h 46.38m 2h 41.75m 0h 0.50m 0h 0.35m   Latency N1 N2 N3 REM Onset Per. Slp. Eff.  Actual 0h 0.62m 0h 4.33m 0h 0.70m 1h 26.24m 0h 35.107m 0h 57.32m 60.91%   Stg Dur Wake N1 N2 N3 REM  Total 172.0 118.0 142.0 0.0 7.5  Supine 65.5 66.5 32.5 0.0 7.5  Side 106.5 51.5 109.5 0.0 0.0  Prone 0.0 0.0 0.0 0.0 0.0  Upright 0.0 0.0 0.0 0.0 0.0   Stg % Wake N1 N2  N3 REM  Total 39.1 44.0 53.0 0.0 2.8  Supine 14.9 24.8 12.1 0.0 2.8  Side 24.2 19.2 40.9 0.0 0.0  Prone 0.0 0.0 0.0 0.0 0.0  Upright 0.0 0.0 0.0 0.0 0.0     Apnea Summary Sub Supine Side Prone Upright  Total 140 Total 140 92 48 0 0    REM 8 8 0 0 0    NREM 132 84 48 0 0  Obs 128 REM 8 8 0 0 0    NREM 120 76 44 0 0  Mix 4 REM 0 0 0 0 0    NREM 4 4 0 0 0  Cen 8 REM 0 0 0 0 0    NREM 8 4 4  0 0   Rera Summary Sub Supine Side Prone Upright  Total 0 Total 0 0 0 0 0    REM 0 0  0 0 0    NREM 0 0 0 0 0   Hypopnea Summary Sub Supine Side Prone Upright  Total 109 Total 109 49 60 0 0    REM 4 4 0 0 0    NREM 105 45 60 0 0   4% Hypopnea Summary Sub Supine Side Prone Upright  Total (4%) 62 Total 62 35 27 0 0    REM 4 4 0 0 0    NREM 58 31 27 0 0     AHI Total Obs Mix Cen  55.75 Apnea 31.34 28.66 0.90 1.79   Hypopnea 24.40 -- -- --  45.22 Hypopnea (4%) 13.88 -- -- --    Total Supine Side Prone Upright  Position AHI 55.75 79.44 40.12 0.00 0.00  REM AHI 96.00   NREM AHI 54.69   Position RDI 55.75 79.44 40.12 0.00 0.00  REM RDI 96.00   NREM RDI 54.69    4% Hypopnea Total Supine Side Prone Upright  Position AHI (4%) 45.22 71.55 27.86 0.00 0.00  REM AHI (4%) 96.00   NREM AHI (4%) 43.85   Position RDI (4%) 45.22 71.55 27.86 0.00 0.00  REM RDI (4%) 96.00   NREM RDI (4%) 43.85    Desaturation Information Threshold: 2% <100% <90% <80% <70% <60% <50% <40%  Supine 181.0 27.0 0.0 0.0 0.0 0.0 0.0  Side 196.0 59.0 0.0 0.0 0.0 0.0 0.0  Prone 0.0 0.0 0.0 0.0 0.0 0.0 0.0  Upright 0.0 0.0 0.0 0.0 0.0 0.0 0.0  Total 377.0 86.0 0.0 0.0 0.0 0.0 0.0  Index 56.0 12.8 0.0 0.0 0.0 0.0 0.0   Threshold: 3% <100% <90% <80% <70% <60% <50% <40%  Supine 148.0 27.0 0.0 0.0 0.0 0.0 0.0  Side 129.0 44.0 0.0 0.0 0.0 0.0 0.0  Prone 0.0 0.0 0.0 0.0 0.0 0.0 0.0  Upright 0.0 0.0 0.0 0.0 0.0 0.0 0.0  Total 277.0 71.0 0.0 0.0 0.0 0.0 0.0  Index 41.1 10.5 0.0 0.0 0.0 0.0 0.0   Threshold: 4% <100% <90% <80% <70% <60% <50% <40%  Supine 108.0 26.0 0.0 0.0 0.0 0.0 0.0  Side 68.0 31.0 0.0 0.0 0.0 0.0 0.0  Prone 0.0 0.0 0.0 0.0 0.0 0.0 0.0  Upright 0.0 0.0 0.0 0.0 0.0 0.0 0.0  Total 176.0 57.0 0.0 0.0 0.0 0.0 0.0  Index 26.1 8.5 0.0 0.0 0.0 0.0 0.0   Threshold: 3% <100% <90% <80% <70% <60% <50% <40%  Supine 148 27 0 0 0 0 0  Side 129 44 0 0 0 0 0  Prone 0 0 0 0 0 0  0  Upright 0 0 0 0 0 0 0  Total 277 71 0 0 0 0 0   Awakening/Arousal Information # of Awakenings 67  Wake after  sleep onset 136.67m  Wake after persistent sleep 120.37m   Arousal Assoc. Arousals Index  Apneas 92 20.6  Hypopneas 63 14.1  Leg Movements 21 4.7  Snore 0 0.0  PTT Arousals 0 0.0  Spontaneous 93 20.8  Total 263 58.9  Leg Movement Information PLMS LMs Index  Total LMs during PLMS 131 29.3  LMs w/ Microarousals 12 2.7   LM LMs Index  w/ Microarousal 8 1.8  w/ Awakening 5 1.1  w/ Resp Event 3 0.7  Spontaneous 21 4.7  Total 30 6.7     Desaturation threshold setting: 3% Minimum desaturation setting: 10 seconds SaO2 nadir: 85% The longest event was a 32 sec obstructive Apnea with a minimum SaO2 of 90%. The lowest SaO2 was 86% associated with a 11 sec central Apnea. EKG Rates EKG Avg Max Min  Awake 66 108 53  Asleep 65 80 52  EKG Events: N/A

## 2023-08-20 NOTE — Addendum Note (Signed)
Addended by: Huston Foley on: 08/20/2023 06:48 PM   Modules accepted: Orders

## 2023-08-21 ENCOUNTER — Telehealth: Payer: Self-pay | Admitting: *Deleted

## 2023-08-21 NOTE — Telephone Encounter (Signed)
-----   Message from Huston Foley sent at 08/20/2023  6:48 PM EDT ----- Urgent set up requested on PAP therapy, due to severe OSA. Patient referred by PCP, seen by me on 04/21/23 for re-eval of his OSA, current not on a PAP machine, diagnostic PSG on 08/18/23.    Please call and notify the patient that the recent sleep study showed severe obstructive sleep apnea. I recommend treatment for in the form of autoPAP. We may consider at a CPAP titration study at a later date, if need be, which means, that we would ask him to come back in for a second sleep study with CPAP treatment. For now, I would like to start him on a so-called autoPAP machine at home, through a DME company (of his choice, or as per insurance requirement). The DME representative will educate him on how to use the machine, how to put the mask on, etc. I have placed an order in the chart. Please send referral, talk to patient, send report to referring MD. We will need a FU in sleep clinic for 10 weeks post-PAP set up, please arrange that with me or one of our NPs. Thanks,   Huston Foley, MD, PhD [facility])

## 2023-08-21 NOTE — Telephone Encounter (Signed)
Spoke to patient gave sleep study result  . Pt aware urgent set up due to severe OSA Pt chose [facility] for DME Pt aware of insurance compliance. Made pt f/u appointment with Dr Frances Furbish 11/2023  sent community message to [facility] about urgent  set up orders for pt . Forward sleep study to PCP Pt expressed understanding and thanked me for calling

## 2023-11-18 ENCOUNTER — Telehealth: Payer: Self-pay | Admitting: Neurology

## 2023-11-18 NOTE — Telephone Encounter (Signed)
 Pt confirming tomorrows appt details

## 2023-11-19 ENCOUNTER — Encounter: Payer: Self-pay | Admitting: Neurology

## 2023-11-19 ENCOUNTER — Ambulatory Visit (INDEPENDENT_AMBULATORY_CARE_PROVIDER_SITE_OTHER): Payer: Medicare (Managed Care) | Admitting: Neurology

## 2023-11-19 VITALS — BP 142/63 | HR 66 | Ht 69.5 in | Wt 260.0 lb

## 2023-11-19 DIAGNOSIS — Z789 Other specified health status: Secondary | ICD-10-CM

## 2023-11-19 DIAGNOSIS — G4739 Other sleep apnea: Secondary | ICD-10-CM

## 2023-11-19 DIAGNOSIS — G4733 Obstructive sleep apnea (adult) (pediatric): Secondary | ICD-10-CM | POA: Diagnosis not present

## 2023-11-19 NOTE — Progress Notes (Signed)
 Subjective:    Patient ID: Andrew Liu is a 83 y.o. male.  HPI    Interim history:   Andrew Liu is an 83 year old male with an underlying medical history of hypertension, chronic kidney disease, gout, paroxysmal A-fib on Eliquis, hyperlipidemia, reflux disease, allergies, BPH, coronary artery disease, aortic atherosclerosis, osteoarthritis, history of PE, and obesity, who presents for follow-up consultation of his obstructive sleep apnea after interim testing and starting home AutoPap therapy.  The patient is unaccompanied today.  I first met him at the request of his primary care physician on 05/01/2023, at which time the patient reported a prior diagnosis of obstructive sleep apnea.  He is no longer using PAP therapy.  He was advised to proceed with a sleep study.  He had a baseline sleep study through our sleep lab on 08/18/2023 which showed severe obstructive sleep apnea with an AHI of 45.2/h, with worsening during REM sleep and supine sleep, O2 nadir 86%.  He was advised to start home AutoPap therapy.  His set up date was 09/01/2023.  He has a ResMed air sense 11 AutoSet machine.  His DME provider is [facility].   Today, 11/19/2023: I reviewed his AutoPap compliance data from 10/19/2023 through 11/17/2023, which is a total of 30 days, during which time he used his machine every night with percent use days greater than 4 hours at 100%, indicating superb compliance with an average usage of 8 hours and 8 minutes, residual AHI elevated at 32.2/h, secondary to significant central events at 10.7/h, average pressure for the 95th percentile at 12 cm with a range of 6 to 13 cm with EPR of 2.  He reports AutoPap is not as easy to tolerate for him, he was on BiPAP therapy for years as he recalls and it felt easier to use.  He is compliant with treatment and uses a traditional style fullface mask.  He would be willing to trial auto BiPAP therapy.  If need be, he is agreeable to coming in for a titration study if  insurance does not cover automatically an auto BiPAP for him.  He has had a change in insurance this year.  He has noticed worsening lower extremity swelling and saw his PCP yesterday.  He was advised to take his Lasix more consistently for now.  He typically uses Lasix as needed.  He may have had more salt recently upon further questioning.  The patient's allergies, current medications, family history, past medical history, past social history, past surgical history and problem list were reviewed and updated as appropriate.   Previously:  04/21/2023: (He) was previously diagnosed with sleep apnea and placed on PAP therapy.  His Epworth sleepiness score is 14 out of 24, fatigue severity score is 51 out of 63.  I reviewed your office note from 02/18/2023.  Prior sleep study results are not available for my review today.  He has reportedly been on BiPAP therapy but has not used his machine in at least 6 months.  He did not bring the machine today, a compliance download is not available for my review today.  He reports that he had a BiPAP machine, he had a [employer] which was recalled, he then apparently purchased a machine out-of-pocket through his DME provider, [facility] but had trouble tolerating the pressure, unclear what pressure settings he was on, he recalls a pressure setting of 5-15.  This could be an AutoPap machine.  He is not using this machine either.  Overall,  when he first got his old machine, he felt benefit, particularly better sleep consolidation and better sleep quality, less daytime somnolence.  He is widowed and lives alone, he lost his only son at age 62 from pancreatic cancer.   His bedtime is generally between 11 PM and midnight and rise time around 8.  He has nocturia about 2-3 times per average night.  He had a tonsillectomy at age 41.  He is not aware of any family history of sleep apnea. His sleep study was about 11 years ago.  He would be willing to get retested  and start treatment again with a PAP machine.  His Past Medical History Is Significant For: Past Medical History:  Diagnosis Date   A-fib (HCC)    Benign prostate hyperplasia    Coronary artery disease    Diverticulitis    Gout    Hyperlipidemia    Hypertension    Kidney disease    Stage 3B   Morbid obesity (HCC)    OSA treated with BiPAP    Osteoarthritis    both knees   Pulmonary embolism (HCC)     His Past Surgical History Is Significant For: History reviewed. No pertinent surgical history.  His Family History Is Significant For: Family History  Problem Relation Age of Onset   Sleep apnea Neg Hx     His Social History Is Significant For: Social History   Socioeconomic History   Marital status: Married    Spouse name: Not on file   Number of children: Not on file   Years of education: Not on file   Highest education level: Not on file  Occupational History   Not on file  Tobacco Use   Smoking status: Never   Smokeless tobacco: Current    Types: Chew  Substance and Sexual Activity   Alcohol use: Never   Drug use: Never   Sexual activity: Not on file  Other Topics Concern   Not on file  Social History Narrative   Caffeine: 1 cup coffee with breakfast   Social Drivers of Health   Financial Resource Strain: Low Risk  (08/18/2023)   Received from [facility]   Overall Financial Resource Strain (CARDIA)    Difficulty of Paying Living Expenses: Not hard at all  Food Insecurity: No Food Insecurity (08/18/2023)   Received from [facility]   Hunger Vital Sign    Worried About Running Out of Food in the Last Year: Never true    Ran Out of Food in the Last Year: Never true  Transportation Needs: No Transportation Needs (05/16/2023)   Received from [facility]   PRAPARE - Transportation    Lack of Transportation (Medical): No    Lack of Transportation (Non-Medical): No  Physical Activity: Sufficiently Active (08/18/2023)   Received from [facility]   Exercise Vital Sign    Days of Exercise per Week: 5 days    Minutes of Exercise per Session: 30 min  Stress: No Stress Concern Present (08/18/2023)   Received from [facility] - Occupational Stress Questionnaire    Feeling of Stress : Not at all  Social Connections: Moderately Integrated (08/18/2023)   Received from [facility]   Social Connection and Isolation Panel [NHANES]    Frequency of Communication with Friends and Family: More than three times a week    Frequency of Social Gatherings with Friends and Family: More than three  times a week    Attends Religious Services: More than 4 times per year    Active Member of Clubs or Organizations: Yes    Attends Banker Meetings: More than 4 times per year    Marital Status: Widowed    His Allergies Are:  No Known Allergies:   His Current Medications Are:  Outpatient Encounter Medications as of 11/19/2023  Medication Sig   allopurinol (ZYLOPRIM) 100 MG tablet Take 100 mg by mouth 2 (two) times daily.   apixaban (ELIQUIS) 5 MG TABS tablet Take 5 mg by mouth 2 (two) times daily.   azelastine (ASTELIN) 0.1 % nasal spray Place 1 spray into both nostrils as needed for rhinitis. Use in each nostril as directed   doxazosin (CARDURA) 2 MG tablet Take 2 mg by mouth 2 (two) times daily.   furosemide (LASIX) 20 MG tablet Take 20 mg by mouth as needed.   lovastatin (MEVACOR) 40 MG tablet Take 40 mg by mouth at bedtime.   metoprolol succinate (TOPROL-XL) 50 MG 24 hr tablet Take 50 mg by mouth daily.   pantoprazole (PROTONIX) 40 MG tablet Take 40 mg by mouth daily. (Patient not taking: Reported on 11/19/2023)   No facility-administered encounter medications on file as of 11/19/2023.  :  Review of Systems:  Out of a complete 14 point review of systems, all are reviewed and negative with the exception of these symptoms as listed below:  Review of Systems  Neurological:         Patient is here alone for autopap follow-up. He thinks things are going ok with it. He does not understand why he was switched from bipap to autopap. He states the biggest he has is water getting in the hose and in his mask. He has adjusted the humidity and it doesn't help. He also states he uses less water which helps only some. ESS 10    Objective:  Neurological Exam  Physical Exam Physical Examination:   Vitals:   11/19/23 0743  BP: (!) 142/63  Pulse: 66    General Examination: The patient is a very pleasant 83 y.o. male in no acute distress. He appears well-developed and well-nourished and well groomed.   HEENT: Normocephalic, atraumatic, pupils are equal, round and reactive to light, corrective eyeglasses in place.  Extraocular tracking is good without limitation to gaze excursion or nystagmus noted. Hearing is grossly intact. Face is symmetric with normal facial animation.  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  Speech is clear with no dysarthria noted. There is no hypophonia. There is no lip, neck/head, jaw or voice tremor. Neck is supple with full range of passive and active motion. There are no carotid bruits on auscultation. Oropharynx exam reveals: Mild mouth dryness, adequate dental hygiene with few teeth on the bottom, edentulous on top, no dentures in place.  Tongue protrudes centrally and palate elevates symmetrically.     Chest: Clear to auscultation without wheezing, rhonchi or crackles noted.   Heart: S1+S2+0, regular and normal without murmurs, rubs or gallops noted.    Abdomen: Soft, non-tender and non-distended.   Extremities: There is 1-2+ pitting edema in the distal lower extremities bilaterally.  He did not take his Lasix today yet.   Skin: Warm and dry without trophic changes noted.    Musculoskeletal: exam reveals no obvious joint deformities.    Neurologically:  Mental status: The patient is awake, alert and oriented in all 4 spheres. His immediate  and remote memory,  attention, language skills and fund of knowledge are appropriate. There is no evidence of aphasia, agnosia, apraxia or anomia. Speech is clear with normal prosody and enunciation. Thought process is linear. Mood is normal and affect is normal.  Cranial nerves II - XII are as described above under HEENT exam.  Motor exam: Normal bulk, strength and tone is noted. There is no obvious action or resting tremor.  Fine motor skills and coordination: grossly intact.  Cerebellar testing: No dysmetria or intention tremor. There is no truncal or gait ataxia.  Sensory exam: intact to light touch in the upper and lower extremities.  Gait, station and balance: He stands easily. No veering to one side is noted. No leaning to one side is noted. Posture is age-appropriate and stance is narrow based. Gait shows normal stride length and normal pace. No problems turning are noted.    Assessment and Plan:  In summary, GERTRUDE TARBET is a very pleasant 83 year old male with an underlying medical history of hypertension, chronic kidney disease, gout, paroxysmal A-fib on Eliquis, hyperlipidemia, reflux disease, allergies, BPH, coronary artery disease, aortic atherosclerosis, osteoarthritis, history of PE, and obesity, who presents for follow-up consultation of his obstructive sleep apnea after interim testing and starting home AutoPap therapy.  He had a baseline sleep study through our sleep lab on 08/18/2023 which showed severe obstructive sleep apnea with an AHI of 45.2/h, with worsening during REM sleep and supine sleep, O2 nadir 86%.  He has been on home AutoPap therapy since 09/01/2023.  He has a ResMed air sense 11 AutoSet machine.  His DME provider is [facility]. While he is fully compliant with treatment, his AHI is elevated, in part because of significant central apneas.  He is advised to proceed with a change in his machine from AutoPap to auto BiPAP.  We will request coverage insurance for this.  If  need be, we may consider bringing him in for a designated BiPAP titration study.  He is commended for his treatment adherence and advised to monitor his swelling in the legs.  He is advised to follow-up in this clinic in about 3 months.  I placed an order for auto BiPAP therapy.  I answered all his questions today and he was in agreement with our plan.   I spent 30 minutes in total face-to-face time and in reviewing records during pre-charting, more than 50% of which was spent in counseling and coordination of care, reviewing test results, reviewing medications and treatment regimen and/or in discussing or reviewing the diagnosis of OSA, CSA, the prognosis and treatment options. Pertinent laboratory and imaging test results that were available during this visit with the patient were reviewed by me and considered in my medical decision making (see chart for details).

## 2023-11-19 NOTE — Patient Instructions (Signed)
 I will order a new machine called autoBiPAP for you.  We may have to do another sleep study for BiPAP titration.

## 2023-11-19 NOTE — Progress Notes (Signed)
 RE: start auto bipap Received: Today New, Adine Neysa Nena GORMAN, RN; New, Bradley; Cain, Mitchell; Garcia, Patricia; Ziegler, Melissa Received, thank you!     Previous Messages    ----- Message ----- From: Neysa Nena GORMAN, RN Sent: 11/19/2023  10:00 AM EST To: Adine Randolm Avelina Jackson; Melissa Ziegler; * Subject: start auto bipap                              New order in epic for pt  Andrew Liu. Male, 83 y.o., [birth year] MRN: [id number]   Seen today, and change to auto bipap  Thanks  Particia

## 2023-11-25 ENCOUNTER — Telehealth: Payer: Self-pay | Admitting: Neurology

## 2023-11-25 DIAGNOSIS — I480 Paroxysmal atrial fibrillation: Secondary | ICD-10-CM

## 2023-11-25 DIAGNOSIS — G4719 Other hypersomnia: Secondary | ICD-10-CM

## 2023-11-25 DIAGNOSIS — Z789 Other specified health status: Secondary | ICD-10-CM

## 2023-11-25 DIAGNOSIS — R351 Nocturia: Secondary | ICD-10-CM

## 2023-11-25 DIAGNOSIS — G4739 Other sleep apnea: Secondary | ICD-10-CM

## 2023-11-25 DIAGNOSIS — E669 Obesity, unspecified: Secondary | ICD-10-CM

## 2023-11-25 DIAGNOSIS — G4733 Obstructive sleep apnea (adult) (pediatric): Secondary | ICD-10-CM

## 2023-11-25 NOTE — Telephone Encounter (Signed)
 Message sent to [facility] to check into this. He had an NPSG in October 2024.

## 2023-11-25 NOTE — Telephone Encounter (Deleted)
 New, Maryella Shivers, Otilio Jefferson, RN; Alain Honey; Jeris Penta, [place]; 1 other Received, thank you!

## 2023-11-25 NOTE — Telephone Encounter (Signed)
 Pt said was told by [facility] need a sleep study for them to fill prescription to be able to  get a BIPAP. Would like a call from the nurses.

## 2023-11-26 NOTE — Telephone Encounter (Addendum)
 I spoke with the patient and let him know Bipap titration study has been ordered. He will watch for call from our sleep center for scheduling. He was appreciative.   I let Brad w/ [facility] know.

## 2023-11-26 NOTE — Telephone Encounter (Signed)
 New, Isaiah Marc, RN; Shirline Dover; Tucker, Dolanda; Ziegler, Melissa; 1 other Hello Garner Dullea,  Yes, Per message on his account from intake team : Patient will need a titrated sleep study done for bipap machine.  His order for bipap has been voided at this time but can be re-opened once titrations has been completed and received.  Thank you,  Tyson Gals

## 2023-11-26 NOTE — Addendum Note (Signed)
 Addended by: Jamario Colina on: 11/26/2023 01:51 PM   Modules accepted: Orders

## 2023-11-26 NOTE — Telephone Encounter (Signed)
 BiPAP titration study ordered.

## 2023-12-04 NOTE — Telephone Encounter (Signed)
Noted, [employer] pending faxed notes.

## 2023-12-09 NOTE — Telephone Encounter (Signed)
Still pending

## 2023-12-18 NOTE — Telephone Encounter (Signed)
 Bipap [employer] auth: [id number] (exp. 12/11/23 to 06/08/24)

## 2023-12-18 NOTE — Telephone Encounter (Signed)
 I spoke with the patient.  BIPAP Auth: [id number] (exp. 12/11/23 to 06/08/24)   He is scheduled at [facility] for 01/13/24 at 9 pm.  Mailed packet to the patient.

## 2024-01-13 ENCOUNTER — Ambulatory Visit: Payer: Medicare (Managed Care) | Admitting: Neurology

## 2024-01-13 DIAGNOSIS — R9431 Abnormal electrocardiogram [ECG] [EKG]: Secondary | ICD-10-CM

## 2024-01-13 DIAGNOSIS — Z789 Other specified health status: Secondary | ICD-10-CM

## 2024-01-13 DIAGNOSIS — G4733 Obstructive sleep apnea (adult) (pediatric): Secondary | ICD-10-CM

## 2024-01-13 DIAGNOSIS — G4720 Circadian rhythm sleep disorder, unspecified type: Secondary | ICD-10-CM

## 2024-01-13 DIAGNOSIS — G4739 Other sleep apnea: Secondary | ICD-10-CM

## 2024-01-13 DIAGNOSIS — G4731 Primary central sleep apnea: Secondary | ICD-10-CM

## 2024-01-21 NOTE — Procedures (Unsigned)
 Physician Interpretation:    Referred by: Lindwood Qua, MD    History and Indication for Testing: 83 year old male with an underlying medical history of hypertension, chronic kidney disease, gout, paroxysmal A-fib on Eliquis, hyperlipidemia, reflux disease, allergies, BPH, coronary artery disease, aortic atherosclerosis, osteoarthritis, history of PE, and obesity, who presents for a full night BiPAP titration study. He had a baseline sleep study through on 08/18/2023 which showed severe obstructive sleep apnea with an AHI of 45.2/h, with worsening during REM sleep and supine sleep, O2 nadir 86%.  He has been on autoPAP therapy since 09/01/2023. While he is compliant with treatment, he has an elevated AHI of 32.2/h, secondary to significant central events. He has had difficulty tolerating autoPAP. He was on BiPAP therapy for years. His insurance did not approve an autoBiPAP trial.    EEG: Review of the EEG showed no abnormal electrical discharges and symmetrical bihemispheric findings.     EKG: The EKG revealed normal sinus rhythm (NSR). ***   AUDIO/VIDEO REVIEW: The audio and video review did not show any abnormal or unusual behaviors, movements, phonations or vocalizations. The patient took *** restroom breaks. Snoring was noted, ***   POST-STUDY QUESTIONNAIRE: Post study, the patient indicated, that sleep was *** the same as usual.    IMPRESSION:   ***Obstructive Sleep Apnea (OSA) ***Central Sleep Apnea (CSA) ***Primary Snoring ***Primary Central Sleep Apnea ***Complex Sleep Apnea ***PLMD (periodic limb movement disorder [of sleep]) ***Dysfunctions associated with sleep stages or arousal from sleep ***Non-specific abnormal electrocardiogram (EKG) ***Poor sleep pattern ***Inconclusive Test   RECOMMENDATIONS:        I certify that I have reviewed the entire raw data recording prior to the issuance of this report in accordance with the Standards of Accreditation of the  American Academy of Sleep Medicine (AASM).   Huston Foley, MD, PhD Medical Director, [facility]) Diplomat, ABPN (Neurology and Sleep)   Technical Report:   [facility] CPAP Summary    General Information  Name: Zaylen, Susman BMI: 36.92 Physician: Huston Foley, MD  ID: [id number] Height: 69.0 in Technician: Domingo Cocking, RPSGT  Sex: Male Weight: 250.0 lb Record: [id number]  Age: 1 [01/30/1941] Date: 01/13/2024     Medical & Medication History    83 year old male with an underlying medical history of hypertension, chronic kidney disease, gout, paroxysmal A-fib on Eliquis, hyperlipidemia, reflux disease, allergies, BPH, coronary artery disease, aortic atherosclerosis, osteoarthritis, history of PE, and obesity, who was previously diagnosed with sleep apnea and placed on PAP therapy. His Epworth sleepiness score is 14 out of 24, fatigue severity score is 51 out of 63. He may have been on an autoPAP and/or a BiPAP machine in the past, but has not used a PAP machine in over 9 months. He presents for re-evaluation of his sleep apnea. Height: 69 in Weight: 250 lb (BMI 36) Neck Size: 18 in  Zyloprim, Eliquis, AStelin, Cardura, Lasix, Mevacor, Toprol-XL, Protonix   Sleep Disorder      Comments Patient arrived for a BiPAP titration polysomnogram. Patient has a history of sleep apnea with self reported good usage of BiPAP in the past. Currently patient is using an Auto-PAP and not tolerating well, he is here to be titrated on BiPAP. Procedure explained and all questions answered. Patient typically uses an Auto CPAP with good control of respiratory events while using a large F20 full face mask. Patient was shown BiPAP at 10/5 cm/H2O with a large Vitera full face  mask, a large AirTouch F20 full face mask and an AirFit F30 full face mask. BiPAP started at 10/5 with his choice of mask, the large AirTouch F20 full face mask and  pressure was increased to BiPAP ST 20/16 cmH2O, BUR = 16, in an effort to control respiratory events and abolish snoring. Cardiac arrhythmias noted, having occasional PVC's. Patient does have a known cardiac history. Patient slept supine and left. No significant PLMS observed. Patient had no restroom visit.        CPAP start time: 10:00:13 PM CPAP end time: 04:55:57 AM   Time Total Supine Side Prone Upright  Recording (TRT) 6h 55.63m 6h 4.66m 0h 51.6m 0h 0.87m 0h 0.14m  Sleep (TST) 5h 9.80m 4h 26.60m 0h 43.5m 0h 0.56m 0h 0.69m   Latency N1 N2 N3 REM Onset Per. Slp. Eff.  Actual 0h 0.43m 0h 7.60m 0h 39.52m 1h 18.51m 0h 32.63m 0h 55.25m 74.49%   Stg Dur Wake N1 N2 N3 REM  Total 106.0 66.5 179.0 19.0 45.0  Supine 98.0 63.0 152.0 6.5 45.0  Side 8.0 3.5 27.0 12.5 0.0  Prone 0.0 0.0 0.0 0.0 0.0  Upright 0.0 0.0 0.0 0.0 0.0   Stg % Wake N1 N2 N3 REM  Total 25.5 21.5 57.8 6.1 14.5  Supine 23.6 20.4 49.1 2.1 14.5  Side 1.9 1.1 8.7 4.0 0.0  Prone 0.0 0.0 0.0 0.0 0.0  Upright 0.0 0.0 0.0 0.0 0.0     Apnea Summary Sub Supine Side Prone Upright  Total 94 Total 94 91 3 0 0    REM 5 5 0 0 0    NREM 89 86 3 0 0  Obs 28 REM 0 0 0 0 0    NREM 28 28 0 0 0  Mix 8 REM 0 0 0 0 0    NREM 8 8 0 0 0  Cen 58 REM 5 5 0 0 0    NREM 53 50 3 0 0   Rera Summary Sub Supine Side Prone Upright  Total 0 Total 0 0 0 0 0    REM 0 0 0 0 0    NREM 0 0 0 0 0   Hypopnea Summary Sub Supine Side Prone Upright  Total 65 Total 65 57 8 0 0    REM 4 4 0 0 0    NREM 61 53 8 0 0   4% Hypopnea Summary Sub Supine Side Prone Upright  Total (4%) 15 Total 15 9 6  0 0    REM 1 1 0 0 0    NREM 14 8 6  0 0     AHI Total Obs Mix Cen  30.82 Apnea 18.22 5.43 1.55 11.24   Hypopnea 12.60 -- -- --  21.13 Hypopnea (4%) 2.91 -- -- --    Total Supine Side Prone Upright  Position AHI 30.82 33.32 15.35 0.00 0.00  REM AHI 12.00   NREM AHI 34.03   Position RDI 30.82 33.32 15.35 0.00 0.00  REM RDI 12.00   NREM RDI 34.03    4% Hypopnea  Total Supine Side Prone Upright  Position AHI (4%) 21.13 22.51 12.56 0.00 0.00  REM AHI (4%) 8.00   NREM AHI (4%) 23.36   Position RDI (4%) 21.13 22.51 12.56 0.00 0.00  REM RDI (4%) 8.00   NREM RDI (4%) 23.36    Desaturation Information  <100% <90% <80% <70% <60% <50% <40%  Supine 169 2 0 0 0 0 0  Side 10 0  0 0 0 0 0  Prone 0 0 0 0 0 0 0  Upright 0 0 0 0 0 0 0  Total 179 2 0 0 0 0 0  Desaturation threshold setting: 3% Minimum desaturation setting: 10 seconds SaO2 nadir: 88% The longest event was a 35 sec obstructive Apnea with a minimum SaO2 of 92%. The lowest SaO2 was 88% associated with a 17 sec obstructive Apnea. EKG Rates EKG Avg Max Min  Awake 58 83 48  Asleep 54 71 46  EKG Events: N/A Awakening/Arousal Information # of Awakenings 50  Wake after sleep onset 73.71m  Wake after persistent sleep 67.46m   Arousal Assoc. Arousals Index  Apneas 37 7.2  Hypopneas 33 6.4  Leg Movements 18 3.5  Snore 0.0 0.0  PTT Arousals 0 0.0  Spontaneous 123 23.8  Total 209 40.5  Myoclonus Information PLMS LMs Index  Total LMs during PLMS 72 14.0  LMs w/ Microarousals 6 1.2   LM LMs Index  w/ Microarousal 11 2.1  w/ Awakening 3 0.6  w/ Resp Event 1 0.2  Spontaneous 13 2.5  Total 25 4.8      Titration Table:  [facility] Enhanced PAP Report    General Information  Name: Krist, Rosenboom BMI: 36 Physician:  Huston Foley, MD  ID: [id number] Height: 69 in Technician: Domingo Cocking  Sex: Male Weight: 250 lb Record: [id number]  Age: 50 [27-Jun-1941] Date: 01/13/2024 Scorer: Domingo Cocking    Recommended Settings  Protocol:   N/A  Device:   N/A  Mask:   N/A AHI:    N/A    Pressure Support:     N/A to   N/A cmH20   IPAP:     N/A to   N/A cmH20  EPAP:     N/A to   N/A cmH20      Max Pressure:     N/A  Backup Rate:     N/A  Humidity:     N/A AHI (4%):   N/A   Pressure Settings Phase THERAPY THERAPY THERAPY THERAPY THERAPY THERAPY THERAPY  THERAPY   Protocol - - - - - - - -   Max Pressure - - - - - - - -   IPAP 10 12 14 15 16 16  00 16   EPAP 05 07 09 10 11 11  00 12   PS - - - - - - - -   Device - - - - - - - -   Mask - - - - - - - -   Backup Rate - - - - - 16 - 16   Humidity - - - - - - - -  Time TRT 45.48m 56.63m 22.34m 38.28m 70.28m 41.33m 1.41m 60.25m   TST 12.84m 46.79m 22.70m 33.24m 59.35m 33.61m 1.70m 45.56m   Event Epoch 9 100 212 257 334 [phone]  Sleep Stage % Wake 72.5 17.9 2.2 13.0 15.7 19.5 0.0 25.6   % REM 0.0 0.0 52.3 41.8 7.6 0.0 0.0 0.0   % N1 88.0 9.8 13.6 9.0 11.9 28.8 0.0 21.1   % N2 12.0 63.0 34.1 49.3 69.5 71.2 100.0 78.9   % N3 0.0 27.2 0.0 0.0 11.0 0.0 0.0 0.0  Respiratory Total Events 11 15 12 26  35 18 0 18   Obs. Apn. 6 2 0 3 6 1  0 2   Mixed Apn. 0 0 0 2 2 0 0 2   Cen. Apn.  1 5 7 17 24 2  0 1   Obs. Hyp. 4 8 5 4 3 15  0 13   Cen. Hyp. 0 0 0 0 0 0 0 0   AHI 52.80 19.57 32.73 46.57 35.59 32.73 0.00 24.00   Supine AHI 52.80 80.00 32.73 46.57 35.59 32.73 0.00 24.00   Prone AHI 0.00 0.00 0.00 0.00 0.00 0.00 0.00 0.00   Side AHI 0.00 15.35 0.00 0.00 0.00 0.00 0.00 0.00  Respiratory (4%) Obs. Hyp. (4%) 4.00 6.00 0.00 0.00 0.00 2.00 0.00 0.00   Cen. Hyp. (4%) 0.00 0.00 0.00 0.00 0.00 0.00 0.00 0.00   AHI (4%) 52.80 16.96 19.09 39.40 32.54 9.09 0.00 6.67   Supine AHI (4%) 52.80 80.00 19.09 39.40 32.54 9.09 0.00 6.67   Prone AHI (4%) 0.00 0.00 0.00 0.00 0.00 0.00 0.00 0.00   Side AHI (4%) 0.00 12.56 0.00 0.00 0.00 0.00 0.00 0.00  Desat Profile <= 90% 0.75m 0.65m 0.31m 0.58m 2.56m 0.74m 0.52m 0.88m   <= 80% 0.70m 0.41m 0.34m 0.60m 2.58m 0.40m 0.38m 0.73m   <= 70% 0.23m 0.1m 0.2m 0.60m 2.71m 0.54m 0.31m 0.54m   <= 60% 0.68m 0.37m 0.69m 0.40m 2.58m 0.41m 0.53m 0.23m  Arousal Index Apnea 19.2 7.8 0.0 14.3 7.1 1.8 0.0 2.7   Hypopnea 9.6 5.2 2.7 3.6 2.0 16.4 0.0 5.3   LM 0.0 1.3 0.0 0.0 2.0 9.1 60.0 5.3   Spontaneous 19.2 18.3 24.5 30.4 23.4 21.8 0.0 33.3   Pressure Settings Phase THERAPY THERAPY   Protocol - -   Max Pressure - -   IPAP  18 20   EPAP 14 16   PS - -   Device - -   Mask - -   Backup Rate 16 16   Humidity - -  Time TRT 64.78m 16.36m   TST 48.79m 9.15m   Event Epoch 679 [id number]  Sleep Stage % Wake 24.8 43.8   % REM 30.9 0.0   % N1 23.7 83.3   % N2 45.4 16.7   % N3 0.0 0.0  Respiratory Total Events 20 4   Obs. Apn. 8 0   Mixed Apn. 2 0   Cen. Apn. 1 0   Obs. Hyp. 9 4   Cen. Hyp. 0 0   AHI 24.74 26.67   Supine AHI 24.74 26.67   Prone AHI 0.00 0.00   Side AHI 0.00 0.00  Respiratory (4%) Obs. Hyp. (4%) 1.00 2.00   Cen. Hyp. (4%) 0.00 0.00   AHI (4%) 14.85 13.33   Supine AHI (4%) 14.85 13.33   Prone AHI (4%) 0.00 0.00   Side AHI (4%) 0.00 0.00  Desat Profile <= 90% 0.99m 0.59m   <= 80% 0.51m 0.24m   <= 70% 0.24m 0.17m   <= 60% 0.30m 0.103m  Arousal Index Apnea 11.1 0.0   Hypopnea 7.4 20.0   LM 6.2 0.0   Spontaneous 19.8 20.0

## 2024-01-23 NOTE — Addendum Note (Signed)
 Addended by: Huston Foley on: 01/23/2024 01:45 PM   Modules accepted: Orders

## 2024-01-28 ENCOUNTER — Telehealth: Payer: Self-pay | Admitting: *Deleted

## 2024-01-28 NOTE — Telephone Encounter (Signed)
 I spoke with the patient and dicussed his sleep study results. The patient is aware that Dr Frances Furbish has placed an order for patient to get a bipap machine. He will have to be in touch with DME regarding returning the autopap. We discussed the insurance compliance requirements which includes using the machine at least 4 hours each night and also being seen between 60 and 90 days for initial follow-up. The patient moved his 4/8 appt to 6/11 at 12:45 pm with Maralyn Sago NP. His questions were answered and he verbalized appreciation for the call.   Bipap order sent to [facility]. Sleep study report sent to referring provider.

## 2024-01-28 NOTE — Telephone Encounter (Signed)
-----   Message from Huston Foley sent at 01/23/2024  1:45 PM EDT ----- Patient was seen on 11/19/23. He had trouble with autoPAP tolerance. He had a BiPAP titration study on 01/13/24.  Please advise pt that I will write for a BiPAP machine for home use. He will have to be in touch with his DME about returning the autoPAP and getting a new BiPAP.   He had rare PVCs (extra beats) on EKG, which is usually not alarming. I would recommend FU with cardiology as planned.  He will need a FU within 60-90 days of set up with the new treatment. Please reinforce the need for compliance and make an appointment in sleep clinic for his 1st BiPAP FU, can be with NP.

## 2024-01-29 NOTE — Telephone Encounter (Signed)
 New, Maryella Shivers, Otilio Jefferson, RN; Alain Honey; Jeris Penta, [place]; 1 other Received, thank you!

## 2024-02-17 ENCOUNTER — Ambulatory Visit: Payer: Medicare (Managed Care) | Admitting: Neurology

## 2024-02-20 NOTE — Telephone Encounter (Signed)
 Pt calling to report that he has not heard from DME re: needed BiPap, phone rep offerd pt # to DME, he would like RN to look into and get back with him

## 2024-02-23 NOTE — Telephone Encounter (Signed)
 I called [employer] to check status. Order has been processed and needs scheduling. [facility] will call him today. I called the pt and let him know. He was appreciative.

## 2024-04-21 ENCOUNTER — Ambulatory Visit (INDEPENDENT_AMBULATORY_CARE_PROVIDER_SITE_OTHER): Payer: Medicare (Managed Care) | Admitting: Neurology

## 2024-04-21 ENCOUNTER — Encounter: Payer: Self-pay | Admitting: Neurology

## 2024-04-21 VITALS — BP 138/74 | HR 68 | Ht 69.5 in | Wt 259.0 lb

## 2024-04-21 DIAGNOSIS — G4733 Obstructive sleep apnea (adult) (pediatric): Secondary | ICD-10-CM | POA: Insufficient documentation

## 2024-04-21 NOTE — Progress Notes (Signed)
 Patient: Andrew Liu. Date of Birth: [birth year]  Reason for Visit: Follow up History from: Patient Primary Neurologist: Omar Bibber  ASSESSMENT AND PLAN 83 y.o. year old male   1.  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  Encouraged to continue to use nightly for minimum 4 hours.  We will continue current settings.  He has had very good benefit.  His residual AHI is 8.7.  With CPAP alone, he continued to have an AHI of 32.  He had a challenging BiPAP titration in March 2025.  Even with BiPAP ST his central and obstructive sleep apnea events were not completely eliminated.  Thus far, he has been very pleased with BiPAP ST, has noted excellent subjective benefit.  He will follow-up in 6 months with Dr. Omar Bibber   HISTORY OF PRESENT ILLNESS: Today 04/21/24 Saw Dr. Omar Bibber January 2025.  PSG October 2027 showed severe OSA.  Set up CPAP 09/01/23.  At follow-up in January 2025 had residual AHI of 32 with 100% compliance.Had BIPAP titration study 01/13/24.  It was a challenging titration study, standard BiPAP did not provide any major improvement of his central or obstructive disordered breathing.  BiPAP ST improved his complex respiratory events but did not completely eliminate.  Recommended BiPAP ST at a pressure of 18/14 cm.  A large fullface mask AirTouch F20 was utilized.  CPAP data of the last 30 days shows 97% usage, IPAP 18, EPAP 14.  AHI 8.7.  Leak 12.6.  He has found being back on BiPAP to be a blessing.  He is able to sleep through the night.  He has 50% more energy during the day.  He lives alone, mentions he is widowed, his son has also passed away.  He drives, is independent with managing his household and daily activities.  He is very pleased with BiPAP, wishes to continue.  HISTORY  11/19/23 Dr. Omar Bibber: Mr. Ruddy is an 83 year old male with an underlying medical history of hypertension, chronic kidney disease, gout, paroxysmal A-fib on Eliquis, hyperlipidemia,  reflux disease, allergies, BPH, coronary artery disease, aortic atherosclerosis, osteoarthritis, history of PE, and obesity, who presents for follow-up consultation of his obstructive sleep apnea after interim testing and starting home AutoPap therapy.  The patient is unaccompanied today.  I first met him at the request of his primary care physician on 05/01/2023, at which time the patient reported a prior diagnosis of obstructive sleep apnea.  He is no longer using PAP therapy.  He was advised to proceed with a sleep study.  He had a baseline sleep study through our sleep lab on 08/18/2023 which showed severe obstructive sleep apnea with an AHI of 45.2/h, with worsening during REM sleep and supine sleep, O2 nadir 86%.  He was advised to start home AutoPap therapy.  His set up date was 09/01/2023.  He has a ResMed air sense 11 AutoSet machine.  His DME provider is [facility].     Today, 11/19/2023: I reviewed his AutoPap compliance data from 10/19/2023 through 11/17/2023, which is a total of 30 days, during which time he used his machine every night with percent use days greater than 4 hours at 100%, indicating superb compliance with an average usage of 8 hours and 8 minutes, residual AHI elevated at 32.2/h, secondary to significant central events at 10.7/h, average pressure for the 95th percentile at 12 cm with a range of 6 to 13 cm with EPR of 2.  He reports AutoPap is  not as easy to tolerate for him, he was on BiPAP therapy for years as he recalls and it felt easier to use.  He is compliant with treatment and uses a traditional style fullface mask.  He would be willing to trial auto BiPAP therapy.  If need be, he is agreeable to coming in for a titration study if insurance does not cover automatically an auto BiPAP for him.  He has had a change in insurance this year.  He has noticed worsening lower extremity swelling and saw his PCP yesterday.  He was advised to take his Lasix more consistently for now.  He  typically uses Lasix as needed.  He may have had more salt recently upon further questioning.  REVIEW OF SYSTEMS: Out of a complete 14 system review of symptoms, the patient complains only of the following symptoms, and all other reviewed systems are negative.  See HPI  ALLERGIES: No Known Allergies  HOME MEDICATIONS: Outpatient Medications Prior to Visit  Medication Sig Dispense Refill   allopurinol (ZYLOPRIM) 100 MG tablet Take 100 mg by mouth 2 (two) times daily.     apixaban (ELIQUIS) 5 MG TABS tablet Take 5 mg by mouth 2 (two) times daily.     azelastine (ASTELIN) 0.1 % nasal spray Place 1 spray into both nostrils as needed for rhinitis. Use in each nostril as directed     doxazosin (CARDURA) 2 MG tablet Take 2 mg by mouth 2 (two) times daily.     furosemide (LASIX) 20 MG tablet Take 20 mg by mouth as needed.     lovastatin (MEVACOR) 40 MG tablet Take 40 mg by mouth at bedtime.     metoprolol succinate (TOPROL-XL) 50 MG 24 hr tablet Take 50 mg by mouth daily.     pantoprazole (PROTONIX) 40 MG tablet Take 40 mg by mouth daily. (Patient not taking: Reported on 11/19/2023)     No facility-administered medications prior to visit.    PAST MEDICAL HISTORY: Past Medical History:  Diagnosis Date   A-fib (HCC)    Benign prostate hyperplasia    Coronary artery disease    Diverticulitis    Gout    Hyperlipidemia    Hypertension    Kidney disease    Stage 3B   Morbid obesity (HCC)    OSA treated with BiPAP    Osteoarthritis    both knees   Pulmonary embolism (HCC)     PAST SURGICAL HISTORY: No past surgical history on file.  FAMILY HISTORY: Family History  Problem Relation Age of Onset   Sleep apnea Neg Hx     SOCIAL HISTORY: Social History   Socioeconomic History   Marital status: Married    Spouse name: Not on file   Number of children: Not on file   Years of education: Not on file   Highest education level: Not on file  Occupational History   Not on file   Tobacco Use   Smoking status: Never   Smokeless tobacco: Current    Types: Chew  Substance and Sexual Activity   Alcohol use: Never   Drug use: Never   Sexual activity: Not on file  Other Topics Concern   Not on file  Social History Narrative   Caffeine: 1 cup coffee with breakfast   Social Drivers of Health   Financial Resource Strain: Low Risk  (08/18/2023)   Received from [facility]   Overall Financial Resource Strain (CARDIA)    Difficulty of Paying Living Expenses:  Not hard at all  Food Insecurity: No Food Insecurity (08/18/2023)   Received from [facility]   Hunger Vital Sign    Worried About Running Out of Food in the Last Year: Never true    Ran Out of Food in the Last Year: Never true  Transportation Needs: No Transportation Needs (05/16/2023)   Received from [facility] - Transportation    Lack of Transportation (Medical): No    Lack of Transportation (Non-Medical): No  Physical Activity: Sufficiently Active (08/18/2023)   Received from [facility]   Exercise Vital Sign    Days of Exercise per Week: 5 days    Minutes of Exercise per Session: 30 min  Stress: No Stress Concern Present (08/18/2023)   Received from [facility] - Occupational Stress Questionnaire    Feeling of Stress : Not at all  Social Connections: Moderately Integrated (08/18/2023)   [facility]   Social Connection and Isolation Panel [NHANES]    Frequency of Communication with Friends and Family: More than three times a week    Frequency of Social Gatherings with Friends and Family: More than three times a week    Attends Religious Services: More than 4 times per year    Active Member of [employer] or Organizations: Yes    Attends Banker Meetings: More than 4 times per year    Marital Status: Widowed  Intimate Partner Violence: Not At Risk (06/19/2020)   Received from [facility]    Humiliation, Afraid, Rape, and Kick questionnaire    Fear of Current or Ex-Partner: No    Emotionally Abused: No    Physically Abused: No    Sexually Abused: No    PHYSICAL EXAM  There were no vitals filed for this visit. There is no height or weight on file to calculate BMI.  Generalized: Well developed, in no acute distress  Neurological examination  Mentation: Alert oriented to time, place, history taking. Follows all commands speech and language fluent Cranial nerve II-XII: Pupils were equal round reactive to light. Extraocular movements were full, visual field were full on confrontational test. Facial sensation and strength were normal. Uvula tongue midline. Head turning and shoulder shrug  were normal and symmetric. Motor: The motor testing reveals 5 over 5 strength of all 4 extremities. Good symmetric motor tone is noted throughout.  Sensory: Sensory testing is intact to soft touch on all 4 extremities. No evidence of extinction is noted.  Coordination: Cerebellar testing reveals good finger-nose-finger and heel-to-shin bilaterally.  Gait and station: Gait is normal. Tandem gait is normal. Romberg is negative. No drift is seen.  Reflexes: Deep tendon reflexes are symmetric and normal bilaterally.   DIAGNOSTIC DATA (LABS, IMAGING, TESTING) - I reviewed patient records, labs, notes, testing and imaging myself where available.  No results found for: WBC, HGB, HCT, MCV, PLT No results found for: NA, K, CL, CO2, GLUCOSE, BUN, CREATININE, CALCIUM, PROT, ALBUMIN, AST, ALT, ALKPHOS, BILITOT, GFRNONAA, GFRAA No results found for: CHOL, HDL, LDLCALC, LDLDIRECT, TRIG, CHOLHDL No results found for: UJWJ1B No results found for: VITAMINB12 No results found for: TSH  Jeanmarie Millet, AGNP-C, DNP 04/21/2024, 5:32 AM [facility] [address] [place], [place] [address] [phone]

## 2024-04-21 NOTE — Patient Instructions (Signed)
 Great to meet you today!! Continue the excellent work with BiPAP! Continue nightly use minimum 4 hours.  We will continue current settings.  Continue to replace supplies through your DME.  We will follow-up in 6 months.  Please call if you need anything.  Thanks!!

## 2024-10-25 ENCOUNTER — Ambulatory Visit: Payer: Medicare (Managed Care) | Admitting: Neurology

## 2024-10-25 ENCOUNTER — Telehealth: Payer: Self-pay | Admitting: Neurology

## 2024-10-25 NOTE — Telephone Encounter (Signed)
 Patient cancelled appointment due to being sick. Transferred patient to Billing to discuss no show fee.

## 2024-12-08 ENCOUNTER — Encounter: Payer: Self-pay | Admitting: Neurology

## 2024-12-08 ENCOUNTER — Ambulatory Visit (INDEPENDENT_AMBULATORY_CARE_PROVIDER_SITE_OTHER): Payer: Medicare (Managed Care) | Admitting: Neurology

## 2024-12-08 VITALS — BP 127/68 | HR 78 | Ht 69.5 in | Wt 254.0 lb

## 2024-12-08 DIAGNOSIS — G4733 Obstructive sleep apnea (adult) (pediatric): Secondary | ICD-10-CM | POA: Diagnosis not present

## 2024-12-08 DIAGNOSIS — R5383 Other fatigue: Secondary | ICD-10-CM | POA: Diagnosis not present

## 2024-12-08 NOTE — Progress Notes (Signed)
 Subjective:    Patient ID: Andrew Liu. is a 84 y.o. male.  HPI    Interim history:   Andrew Liu is an 84 year old male with an underlying medical history of hypertension, chronic kidney disease, gout, paroxysmal A-fib on Eliquis, hyperlipidemia, reflux disease, allergies, BPH, coronary artery disease, aortic atherosclerosis, osteoarthritis, history of PE, and obesity, who presents for follow-up consultation of his obstructive sleep apnea on BiPAP ST therapy.  The patient is unaccompanied today.  He was last seen in our clinic by Lauraine Born, NP in June 2025, at which time he was compliant with his BiPAP ST of 18/14 cm with a backup rate of 16 breaths/min with no reasonable apnea control.  Today, 12/08/2024: I reviewed his BiPAP compliance data from 11/08/2024 through 12/07/2024, which is a total of 30 days, during which time he used his machine every night with percent use days greater than 4 hours at 100%.  Average usage of 7 hours and 45 minutes, residual AHI above goal at 16.2/h, leak on the low side with the 95th percentile at 2.8 L/min on a BiPAP of 18/14 cm with a backup rate of 16. He reports overall doing well with the machine, he is up-to-date with his supplies.  He uses a fullface mask.  He reports residual fatigue.  Of note, he is on a beta-blocker and has a history of A-fib.  He is on Eliquis.  He does not mind the pressure.  He is agreeable to increasing blood pressure at this time. He denies any significant issues with mouth dryness.  He has to adjust the headgear straps and sometimes he has a sore spot in the back of his head, at the nape of his neck.  His Epworth sleepiness score is 7 out of 24, fatigue severity score is 63 out of 63.  Of note, he had a titration study in March 2025 after which he started BiPAP ST at home.  Set up date with BiPAP was 02/26/2024.  The patient's allergies, current medications, family history, past medical history, past social history, past surgical  history and problem list were reviewed and updated as appropriate.    Previously:  04/21/2024 Andrew Born, NP): << Saw Dr. Buck January 2025.  PSG October 2027 showed severe OSA.  Set up CPAP 09/01/23.  At follow-up in January 2025 had residual AHI of 32 with 100% compliance.Had BIPAP titration study 01/13/24.  It was a challenging titration study, standard BiPAP did not provide any major improvement of his central or obstructive disordered breathing.  BiPAP ST improved his complex respiratory events but did not completely eliminate.  Recommended BiPAP ST at a pressure of 18/14 cm.  A large fullface mask AirTouch F20 was utilized.  CPAP data of the last 30 days shows 97% usage, IPAP 18, EPAP 14.  AHI 8.7.  Leak 12.6.  He has found being back on BiPAP to be a blessing.  He is able to sleep through the night.  He has 50% more energy during the day.  He lives alone, mentions he is widowed, his son has also passed away.  He drives, is independent with managing his household and daily activities.  He is very pleased with BiPAP, wishes to continue.>>  11/19/2023 ([place]): I first met him at the request of his primary care physician on 05/01/2023, at which time the patient reported a prior diagnosis of obstructive sleep apnea.  He is no longer using PAP therapy.  He was advised to proceed with a  sleep study.  He had a baseline sleep study through our sleep lab on 08/18/2023 which showed severe obstructive sleep apnea with an AHI of 45.2/h, with worsening during REM sleep and supine sleep, O2 nadir 86%.  He was advised to start home AutoPap therapy.  His set up date was 09/01/2023.  He has a ResMed air sense 11 AutoSet machine.  His DME provider is [facility].  I reviewed his AutoPap compliance data from 10/19/2023 through 11/17/2023, which is a total of 30 days, during which time he used his machine every night with percent use days greater than 4 hours at 100%, indicating superb compliance with an average usage of 8 hours  and 8 minutes, residual AHI elevated at 32.2/h, secondary to significant central events at 10.7/h, average pressure for the 95th percentile at 12 cm with a range of 6 to 13 cm with EPR of 2.  He reports AutoPap is not as easy to tolerate for him, he was on BiPAP therapy for years as he recalls and it felt easier to use.  He is compliant with treatment and uses a traditional style fullface mask.  He would be willing to trial auto BiPAP therapy.  If need be, he is agreeable to coming in for a titration study if insurance does not cover automatically an auto BiPAP for him.  He has had a change in insurance this year.  He has noticed worsening lower extremity swelling and saw his PCP yesterday.  He was advised to take his Lasix more consistently for now.  He typically uses Lasix as needed.  He may have had more salt recently upon further questioning.     04/21/2023: (He) was previously diagnosed with sleep apnea and placed on PAP therapy.  His Epworth sleepiness score is 14 out of 24, fatigue severity score is 51 out of 63.  I reviewed your office note from 02/18/2023.  Prior sleep study results are not available for my review today.  He has reportedly been on BiPAP therapy but has not used his machine in at least 6 months.  He did not bring the machine today, a compliance download is not available for my review today.  He reports that he had a BiPAP machine, he had a [employer] which was recalled, he then apparently purchased a machine out-of-pocket through his DME provider, [facility] but had trouble tolerating the pressure, unclear what pressure settings he was on, he recalls a pressure setting of 5-15.  This could be an AutoPap machine.  He is not using this machine either.  Overall, when he first got his old machine, he felt benefit, particularly better sleep consolidation and better sleep quality, less daytime somnolence.  He is widowed and lives alone, he lost his only son at age 32 from  pancreatic cancer.   His bedtime is generally between 11 PM and midnight and rise time around 8.  He has nocturia about 2-3 times per average night.  He had a tonsillectomy at age 41.  He is not aware of any family history of sleep apnea. His sleep study was about 11 years ago.  He would be willing to get retested and start treatment again with a PAP machine.   His Past Medical History Is Significant For: Past Medical History:  Diagnosis Date   A-fib [facility])    Benign prostate hyperplasia    Coronary artery disease    Diverticulitis    Gout    Hyperlipidemia    Hypertension  Kidney disease    Stage 3B   Morbid obesity (HCC)    OSA treated with BiPAP    Osteoarthritis    both knees   Pulmonary embolism (HCC)     His Past Surgical History Is Significant For: History reviewed. No pertinent surgical history.  His Family History Is Significant For: Family History  Problem Relation Age of Onset   Sleep apnea Neg Hx     His Social History Is Significant For: Social History   Socioeconomic History   Marital status: Married    Spouse name: Not on file   Number of children: Not on file   Years of education: Not on file   Highest education level: Not on file  Occupational History   Not on file  Tobacco Use   Smoking status: Never   Smokeless tobacco: Current    Types: Chew  Substance and Sexual Activity   Alcohol use: Never   Drug use: Never   Sexual activity: Not on file  Other Topics Concern   Not on file  Social History Narrative   Caffeine: 1 cup coffee with breakfast   Patient is retired and lives alone.   Social Drivers of Health   Tobacco Use: High Risk (12/08/2024)   Patient History    Smoking Tobacco Use: Never    Smokeless Tobacco Use: Current    Passive Exposure: Not on file  Financial Resource Strain: Low Risk (09/15/2024)   Received from [facility]   Overall Financial Resource Strain (CARDIA)    How hard is it for you to pay for the very basics  like food, housing, medical care, and heating?: Not hard at all  Food Insecurity: No Food Insecurity (09/15/2024)   Received [facility]   Epic    Within the past 12 months, you worried that your food would run out before you got the money to buy more.: Never true    Within the past 12 months, the food you bought just didn't last and you didn't have money to get more.: Never true  Transportation Needs: No Transportation Needs (09/15/2024)   Received from [facility] - Transportation    Lack of Transportation (Medical): No    Lack of Transportation (Non-Medical): No  Physical Activity: Sufficiently Active (09/15/2024)   Received from [facility]   Exercise Vital Sign    On average, how many days per week do you engage in moderate to strenuous exercise (like a brisk walk)?: 6 days    On average, how many minutes do you engage in exercise at this level?: 30 min  Stress: No Stress Concern Present (09/15/2024)   Received from [facility] - Occupational Stress Questionnaire    Do you feel stress - tense, restless, nervous, or anxious, or unable to sleep at night because your mind is troubled all the time - these days?: Not at all  Social Connections: Socially Isolated (09/15/2024)   Received from [facility]   Social Connection and Isolation Panel    In a typical week, how many times do you talk on the phone with family, friends, or neighbors?: More than three times a week    How often do you get together with friends or relatives?: More than three times a week    How often do you attend church or religious services?: Never    Do you belong to any clubs or organizations such  as church groups, unions, fraternal or athletic groups, or school groups?: No    How often do you attend meetings of the clubs or organizations you belong to?: Never    Are you married, widowed, divorced, separated, never married, or living with a  partner?: Widowed  Depression (PHQ2-9): Not on file  Alcohol Screen: Not on file  Housing: Not on file  Utilities: Low Risk (09/15/2024)   Received from [facility]   Utilities    Within the past 12 months, have you been unable to get utilities(heat, electricity) when it was really needed?: No  Health Literacy: Low Risk (08/21/2024)   Received from [facility]   Health Literacy    : Never    His Allergies Are:  Allergies[1]:   His Current Medications Are:  Outpatient Encounter Medications as of 12/08/2024  Medication Sig   allopurinol (ZYLOPRIM) 100 MG tablet Take 100 mg by mouth 2 (two) times daily.   apixaban (ELIQUIS) 5 MG TABS tablet Take 5 mg by mouth 2 (two) times daily.   azelastine (ASTELIN) 0.1 % nasal spray Place 1 spray into both nostrils as needed for rhinitis. Use in each nostril as directed   doxazosin (CARDURA) 2 MG tablet Take 2 mg by mouth 2 (two) times daily.   furosemide (LASIX) 20 MG tablet Take 20 mg by mouth as needed.   lovastatin (MEVACOR) 40 MG tablet Take 40 mg by mouth at bedtime.   metoprolol succinate (TOPROL-XL) 50 MG 24 hr tablet Take 50 mg by mouth daily.   pantoprazole (PROTONIX) 40 MG tablet Take 40 mg by mouth daily. (Patient not taking: Reported on 12/08/2024)   No facility-administered encounter medications on file as of 12/08/2024.  :  Review of Systems:  Out of a complete 14 point review of systems, all are reviewed and negative with the exception of these symptoms as listed below:   Review of Systems  Objective:  Neurological Exam  Physical Exam Physical Examination:   Vitals:   12/08/24 0732  BP: 127/68  Pulse: 78    General Examination: The patient is a very pleasant 84 y.o. male in no acute distress. He appears somewhat deconditioned.  He is quiet today but able to answer questions appropriately and explain his symptoms appropriately.   HEENT: Normocephalic, atraumatic, pupils are equal, round and reactive to light,  extraocular tracking is well-preserved, corrective eyeglasses in place, hearing grossly intact.  Face is symmetric with normal facial animation.  Speech without dysarthria but scant today, he is not very talkative.  Airway examination reveals mild mouth dryness, otherwise stable findings, edentulous on top, and few teeth on the bottom.  Tongue protrudes centrally and palate elevates symmetrically.  Small red dot at the nape of his neck in the center, looks almost like a pimple, no bleeding or oozing.   Chest: Clear to auscultation without wheezing, rhonchi or crackles noted.   Heart: S1+S2+0, irregularly irregular.    Abdomen: Soft, non-tender and non-distended.   Extremities: There is 1+ pitting edema in the distal lower extremities bilaterally, slightly worse on the left.  He has cut off the top hem of his socks.   Skin: Warm and dry without trophic changes noted.    Musculoskeletal: exam reveals no obvious joint deformities.    Neurologically:  Mental status: The patient is awake, alert and oriented in all 4 spheres. His immediate and remote memory, attention, language skills and fund of knowledge are appropriate. There is no evidence of aphasia, agnosia,  apraxia or anomia. Speech is clear with normal prosody and enunciation. Thought process is linear. Mood is normal and affect is normal.  Cranial nerves II - XII are as described above under HEENT exam.  Motor exam: Normal bulk, moving all 4 extremities without any obvious restriction, no obvious action or resting tremor.  Fine motor skills and coordination: grossly intact.  Cerebellar testing: No dysmetria or intention tremor. There is no truncal or gait ataxia.  Sensory exam: intact to light touch in the upper and lower extremities.  Gait, station and balance: He stands slowly and walks slowly, no problems turning.    Assessment and Plan:  In summary, Andrew Liu is an 84 year old male with an underlying medical history of hypertension,  chronic kidney disease, gout, paroxysmal A-fib on Eliquis, hyperlipidemia, reflux disease, allergies, BPH, coronary artery disease, aortic atherosclerosis, osteoarthritis, history of PE, and obesity, who presents for follow-up consultation of his obstructive sleep apnea, on BiPAP ST.  He tolerates the pressure and is completely compliant with treatment, residual AHI is above goal at 16.2/h at this time.  I suggested we increase his pressure to 19/15 cm at this point.  He is agreeable.  He is commended for his treatment adherence.  As far as fatigue, he is advised that atrial fibrillation can cause tiredness and fatigue and so can his beta-blocker.  Hopefully, tweaking his BiPAP pressure may also help alleviate some of the fatigue he is experiencing.  He is up-to-date with his supplies through [facility].  He has an appointment with cardiology coming up.  Of note, his vitamin B12 level was in the normal range in November 2025.  TSH was normal as well.  A1c was in the normal range at 5.4.  He is advised to call our office in or around March so we can review his compliance data again on the higher pressure setting to make sure things are going in the right direction.  He is advised to follow-up routinely in this clinic to see the nurse practitioner in 1 year, sooner if needed.  I answered all his questions today and he was in agreement.  We reviewed his compliance data at length, and findings were explained to him.   I spent 30 minutes in total face-to-face time and in reviewing records during pre-charting, more than 50% of which was spent in counseling and coordination of care, reviewing test results, reviewing medications and treatment regimen and/or in discussing or reviewing the diagnosis of OSA, fatigue, the prognosis and treatment options. Pertinent laboratory and imaging test results that were available during this visit with the patient were reviewed by me and considered in my medical decision making (see  chart for details).      [1] No Known Allergies

## 2025-12-08 ENCOUNTER — Ambulatory Visit: Payer: Medicare (Managed Care) | Admitting: Neurology
